# Patient Record
Sex: Female | Born: 1970 | Race: White | Hispanic: No | Marital: Married | Smoking: Never smoker
Health system: Southern US, Community
[De-identification: ages and names within clinical notes are randomized; demographics above are authoritative.]

## PROBLEM LIST (undated history)

## (undated) DIAGNOSIS — J45909 Unspecified asthma, uncomplicated: Secondary | ICD-10-CM

## (undated) DIAGNOSIS — E271 Primary adrenocortical insufficiency: Secondary | ICD-10-CM

## (undated) DIAGNOSIS — G43909 Migraine, unspecified, not intractable, without status migrainosus: Secondary | ICD-10-CM

## (undated) DIAGNOSIS — E162 Hypoglycemia, unspecified: Secondary | ICD-10-CM

## (undated) HISTORY — PX: SINUSOTOMY: SHX291

## (undated) HISTORY — PX: TONSILLECTOMY: SUR1361

---

## 1999-05-26 ENCOUNTER — Other Ambulatory Visit: Admission: RE | Admit: 1999-05-26 | Discharge: 1999-05-26 | Payer: Self-pay | Admitting: Obstetrics and Gynecology

## 2000-06-14 ENCOUNTER — Other Ambulatory Visit: Admission: RE | Admit: 2000-06-14 | Discharge: 2000-06-14 | Payer: Self-pay | Admitting: Obstetrics and Gynecology

## 2001-07-22 ENCOUNTER — Other Ambulatory Visit: Admission: RE | Admit: 2001-07-22 | Discharge: 2001-07-22 | Payer: Self-pay | Admitting: Obstetrics and Gynecology

## 2002-02-11 ENCOUNTER — Encounter: Payer: Self-pay | Admitting: Emergency Medicine

## 2002-02-11 ENCOUNTER — Emergency Department (HOSPITAL_COMMUNITY): Admission: EM | Admit: 2002-02-11 | Discharge: 2002-02-11 | Payer: Self-pay | Admitting: Emergency Medicine

## 2002-07-24 ENCOUNTER — Other Ambulatory Visit: Admission: RE | Admit: 2002-07-24 | Discharge: 2002-07-24 | Payer: Self-pay | Admitting: Obstetrics and Gynecology

## 2002-08-11 ENCOUNTER — Emergency Department (HOSPITAL_COMMUNITY): Admission: EM | Admit: 2002-08-11 | Discharge: 2002-08-11 | Payer: Self-pay | Admitting: *Deleted

## 2003-07-30 ENCOUNTER — Other Ambulatory Visit: Admission: RE | Admit: 2003-07-30 | Discharge: 2003-07-30 | Payer: Self-pay | Admitting: Obstetrics and Gynecology

## 2003-08-12 ENCOUNTER — Encounter: Payer: Self-pay | Admitting: Obstetrics and Gynecology

## 2003-08-12 ENCOUNTER — Ambulatory Visit (HOSPITAL_COMMUNITY): Admission: RE | Admit: 2003-08-12 | Discharge: 2003-08-12 | Payer: Self-pay | Admitting: Obstetrics and Gynecology

## 2004-02-05 ENCOUNTER — Ambulatory Visit (HOSPITAL_COMMUNITY): Admission: RE | Admit: 2004-02-05 | Discharge: 2004-02-05 | Payer: Self-pay | Admitting: Obstetrics and Gynecology

## 2004-02-05 ENCOUNTER — Encounter (INDEPENDENT_AMBULATORY_CARE_PROVIDER_SITE_OTHER): Payer: Self-pay | Admitting: Specialist

## 2004-03-21 ENCOUNTER — Ambulatory Visit (HOSPITAL_COMMUNITY): Admission: RE | Admit: 2004-03-21 | Discharge: 2004-03-21 | Payer: Self-pay | Admitting: Obstetrics and Gynecology

## 2004-06-29 ENCOUNTER — Encounter: Admission: RE | Admit: 2004-06-29 | Discharge: 2004-06-29 | Payer: Self-pay | Admitting: Obstetrics and Gynecology

## 2004-07-29 ENCOUNTER — Other Ambulatory Visit: Admission: RE | Admit: 2004-07-29 | Discharge: 2004-07-29 | Payer: Self-pay | Admitting: Obstetrics and Gynecology

## 2004-12-19 ENCOUNTER — Emergency Department (HOSPITAL_COMMUNITY): Admission: EM | Admit: 2004-12-19 | Discharge: 2004-12-19 | Payer: Self-pay | Admitting: Emergency Medicine

## 2005-08-15 ENCOUNTER — Other Ambulatory Visit: Admission: RE | Admit: 2005-08-15 | Discharge: 2005-08-15 | Payer: Self-pay | Admitting: Obstetrics and Gynecology

## 2008-11-23 ENCOUNTER — Emergency Department (HOSPITAL_BASED_OUTPATIENT_CLINIC_OR_DEPARTMENT_OTHER): Admission: EM | Admit: 2008-11-23 | Discharge: 2008-11-23 | Payer: Self-pay | Admitting: Emergency Medicine

## 2008-11-23 ENCOUNTER — Ambulatory Visit: Payer: Self-pay | Admitting: Diagnostic Radiology

## 2010-10-13 ENCOUNTER — Encounter
Admission: RE | Admit: 2010-10-13 | Discharge: 2010-10-13 | Payer: Self-pay | Source: Home / Self Care | Admitting: Family Medicine

## 2011-02-20 LAB — BASIC METABOLIC PANEL
BUN: 12 mg/dL (ref 6–23)
Calcium: 9.3 mg/dL (ref 8.4–10.5)
GFR calc non Af Amer: >60 mL/min (ref >60)
Glucose, Bld: 85 mg/dL (ref 70–99)
Potassium: 3.9 mEq/L (ref 3.5–5.1)
Sodium: 141 mEq/L (ref 135–145)

## 2011-02-20 LAB — D-DIMER, QUANTITATIVE: D-Dimer, Quant: 0.22 ug/mL-FEU (ref 0.00–0.48)

## 2011-02-20 LAB — CBC
Platelets: 222 10*3/uL (ref 150–400)
RDW: 11.5 % (ref 11.5–15.5)
WBC: 7.7 10*3/uL (ref 4.0–10.5)

## 2011-02-20 LAB — POCT CARDIAC MARKERS
CKMB, poc: <1 ng/mL — ABNORMAL LOW (ref 1.0–8.0)
Myoglobin, poc: 30.5 ng/mL (ref 12–200)

## 2011-03-24 NOTE — H&P (Signed)
NAME:  Caitlyn Hale, Caitlyn Hale                              ACCOUNT NO.:  [id number]   MEDICAL RECORD NO.:  [id number]                   PATIENT TYPE:  AMB   LOCATION:  [facility]C                                  FACILITY:  [facility]   PHYSICIAN:  Juluis Mire, M.D.                DATE OF BIRTH:  1971/05/26   DATE OF ADMISSION:  02/05/2004  DATE OF DISCHARGE:                                HISTORY & PHYSICAL   HISTORY OF PRESENT ILLNESS:  The patient is a 40 year old gravida 2 para 2  married white female who presents for diagnostic laparoscopy with laser  standby.   PRESENT ADMISSION:  The patient has had problems with persistent pelvic pain  and discomfort for the past year.  She has had recurrent ovarian cysts that  have resolved on follow-up.  She has been placed on birth control pills for  management of pain without response.  Of note, she does have an IUD in place  for birth control.  In view of persistent pelvic pain and recurrent cysts we  are contemplating the possibility of endometriosis.  She presents for  laparoscopic evaluation today.   ALLERGIES:  No known drug allergies.   MEDICATIONS:  None.   PAST MEDICAL HISTORY:  Usual childhood diseases, no significant sequelae.  No previous surgical history.   OBSTETRICAL HISTORY:  Two spontaneous vaginal deliveries.   FAMILY HISTORY:  Noncontributory.   SOCIAL HISTORY:  Reveals no tobacco or alcohol use.   REVIEW OF SYSTEMS:  Noncontributory.   PHYSICAL EXAMINATION:  VITAL SIGNS:  The patient is afebrile with stable  vital signs.  HEENT:  The patient is normocephalic.  Pupils equal, round, and reactive to  light and accommodation.  Extraocular movements were intact.  Sclerae and  conjunctivae clear, oropharynx clear.  NECK:  Without thyromegaly.  BREASTS:  No discrete masses.  LUNGS:  Clear.  CARDIOVASCULAR:  Regular rhythm and rate without murmurs or gallops.  ABDOMEN:  Benign.  No masses, organomegaly, or tenderness.  PELVIC:  Normal  external genitalia, vaginal mucosa is clear.  Cervix  unremarkable.  Uterus feels to be normal size, shape, and contour.  Adnexa  free of masses or tenderness.  EXTREMITIES:  Trace edema.  NEUROLOGIC:  Grossly within normal limits.   IMPRESSION:  1. Recurrent ovarian cysts.  2. Chronic pelvic pain, possible endometriosis.   PLAN:  The patient to undergo diagnostic laparoscopy with laser standby.  The risks of surgery have been discussed including the risk of infection;  the risk of hemorrhage; the risk of injury to adjacent organs including  bladder, bowel, or ureters that could require further exploratory surgery;  the risk of deep venous thrombosis and pulmonary embolus.  The patient  professed an understanding of the indications and risks.  Juluis Mire, M.D.    JSM/MEDQ  D:  02/05/2004  T:  02/05/2004  Job:  [id number]

## 2011-03-24 NOTE — Op Note (Signed)
NAME:  Caitlyn Hale, Caitlyn Hale                              ACCOUNT NO.:  [id number]   MEDICAL RECORD NO.:  [id number]                   PATIENT TYPE:  AMB   LOCATION:  SDC                                  FACILITY:  [facility]   PHYSICIAN:  Juluis Mire, M.D.                DATE OF BIRTH:  Mar 30, 1971   DATE OF PROCEDURE:  02/05/2004  DATE OF DISCHARGE:                                 OPERATIVE REPORT   PREOPERATIVE DIAGNOSIS:  Pelvic pain with recurrent ovarian cyst.   POSTOPERATIVE DIAGNOSIS:  Pelvic pain with recurrent ovarian cyst with  evidence of a simple cyst of the right ovary and pelvic endometriosis.   OPERATIVE PROCEDURE:  Laparoscopy, cautery of endometriotic implants, right  ovarian cystectomy.   SURGEON:  Juluis Mire, M.D.   ANESTHESIA:  General endotracheal anesthesia.   ESTIMATED BLOOD LOSS:  Minimal.   PACKS AND DRAINS:  None.   BLOOD REPLACED:  None.   COMPLICATIONS:  None.   INDICATIONS FOR PROCEDURE:  As noted in the history and physical.   PROCEDURE IN DETAIL:  The patient was taken to the OR and placed in supine  position.  After a satisfactory level of general endotracheal anesthesia was  obtained, the patient was placed in the dorsal lithotomy position using the  Allen stirrups.  The abdomen, perineum, and vagina were prepped out with  Betadine.  The bladder was emptied with in and out catheterization.  A Hulka  tenaculum was put in place and secured.  The patient was draped out for  laparoscopy.  A subumbilical incision was made with the knife.  The Veress  needle was introduced into the abdominal cavity.  The abdomen was inflated  with approximately 3 liters of carbon dioxide.  The operating laparoscope  was introduced.  There was no notice of injuries to adjacent organs.  The 5  mm trocar was put in place in the suprapubic area under direct  visualization.  The uterus was normal size and shape.  The left tube and  ovary were unremarkable.  The right ovary had  a simple cyst.  She had a  couple of implants of endometriosis in the cul-de-sac area below the cervix.  It was superficial in nature.  Using the bipolar, both areas of  endometriosis were cauterized.  We isolated the right ovary.  We made an  incision into the cyst, yellowish fluid was obtained.  We removed the cyst  lining and it was sent for pathological review.  We used the bipolar to  bring about hemostasis in the right ovary.  We had good hemostasis at this  point.  The upper abdomen including the liver and tip of the gallbladder  were normal.  The cecum was difficult to visualize.  It looked like she had  a redundant ascending colon.  The appendix could not be visualized.  There  was no  evidence of any active bleeding or injury to adjacent organs.  The  ovary was hemostatically intact.  The abdomen was desufflated of carbon  dioxide while all trocars were removed.  The subumbilical incision was  closed with interrupted subcuticular 4-0 Vicryl, the suprapubic incision was  closed with Dermabond.  The Hulka tenaculum was then removed.  Visualization  with the speculum revealed  the IUD to still be in place.  The patient was taken out of the dorsal  lithotomy position, and once alert and extubated, was transferred to the  recovery room in good condition.  Sponge, instrument, and needle counts was  reported correct by the circulating nurse x 2.                                               Juluis Mire, M.D.    JSM/MEDQ  D:  02/05/2004  T:  02/05/2004  Job:  [id number]

## 2012-02-26 ENCOUNTER — Other Ambulatory Visit: Payer: Self-pay | Admitting: Obstetrics and Gynecology

## 2012-02-26 DIAGNOSIS — Z1231 Encounter for screening mammogram for malignant neoplasm of breast: Secondary | ICD-10-CM

## 2012-03-12 ENCOUNTER — Ambulatory Visit
Admission: RE | Admit: 2012-03-12 | Discharge: 2012-03-12 | Disposition: A | Discharge status: Home / Self Care | Payer: PRIVATE HEALTH INSURANCE | Source: Ambulatory Visit | Attending: Obstetrics and Gynecology | Admitting: Obstetrics and Gynecology

## 2012-03-12 DIAGNOSIS — Z1231 Encounter for screening mammogram for malignant neoplasm of breast: Secondary | ICD-10-CM

## 2013-04-07 ENCOUNTER — Other Ambulatory Visit: Payer: Self-pay

## 2013-04-07 DIAGNOSIS — Z1231 Encounter for screening mammogram for malignant neoplasm of breast: Secondary | ICD-10-CM

## 2013-04-17 ENCOUNTER — Other Ambulatory Visit (HOSPITAL_COMMUNITY)
Admission: RE | Admit: 2013-04-17 | Discharge: 2013-04-17 | Disposition: A | Discharge status: Home / Self Care | Payer: BC Managed Care – PPO | Source: Ambulatory Visit | Attending: Obstetrics and Gynecology | Admitting: Obstetrics and Gynecology

## 2013-04-17 ENCOUNTER — Other Ambulatory Visit: Payer: Self-pay | Admitting: Nurse Practitioner

## 2013-04-17 DIAGNOSIS — Z1151 Encounter for screening for human papillomavirus (HPV): Secondary | ICD-10-CM | POA: Insufficient documentation

## 2013-04-17 DIAGNOSIS — Z01419 Encounter for gynecological examination (general) (routine) without abnormal findings: Secondary | ICD-10-CM | POA: Insufficient documentation

## 2013-04-30 ENCOUNTER — Ambulatory Visit
Admission: RE | Admit: 2013-04-30 | Discharge: 2013-04-30 | Disposition: A | Discharge status: Home / Self Care | Payer: PRIVATE HEALTH INSURANCE | Source: Ambulatory Visit

## 2013-04-30 DIAGNOSIS — Z1231 Encounter for screening mammogram for malignant neoplasm of breast: Secondary | ICD-10-CM

## 2014-05-21 ENCOUNTER — Other Ambulatory Visit: Payer: Self-pay

## 2014-05-21 DIAGNOSIS — Z1231 Encounter for screening mammogram for malignant neoplasm of breast: Secondary | ICD-10-CM

## 2014-05-22 ENCOUNTER — Other Ambulatory Visit (HOSPITAL_COMMUNITY)
Admission: RE | Admit: 2014-05-22 | Discharge: 2014-05-22 | Disposition: A | Discharge status: Home / Self Care | Payer: Managed Care, Other (non HMO) | Source: Ambulatory Visit | Attending: Obstetrics and Gynecology | Admitting: Obstetrics and Gynecology

## 2014-05-22 ENCOUNTER — Other Ambulatory Visit: Payer: Self-pay | Admitting: Obstetrics and Gynecology

## 2014-05-22 DIAGNOSIS — Z01419 Encounter for gynecological examination (general) (routine) without abnormal findings: Secondary | ICD-10-CM | POA: Insufficient documentation

## 2014-05-25 ENCOUNTER — Encounter (INDEPENDENT_AMBULATORY_CARE_PROVIDER_SITE_OTHER): Payer: Self-pay

## 2014-05-25 ENCOUNTER — Ambulatory Visit
Admission: RE | Admit: 2014-05-25 | Discharge: 2014-05-25 | Disposition: A | Discharge status: Home / Self Care | Payer: Managed Care, Other (non HMO) | Source: Ambulatory Visit

## 2014-05-25 DIAGNOSIS — Z1231 Encounter for screening mammogram for malignant neoplasm of breast: Secondary | ICD-10-CM

## 2014-05-25 LAB — CYTOLOGY - PAP

## 2014-05-27 ENCOUNTER — Other Ambulatory Visit: Payer: Self-pay | Admitting: Obstetrics and Gynecology

## 2014-05-27 DIAGNOSIS — R928 Other abnormal and inconclusive findings on diagnostic imaging of breast: Secondary | ICD-10-CM

## 2014-05-29 ENCOUNTER — Ambulatory Visit
Admission: RE | Admit: 2014-05-29 | Discharge: 2014-05-29 | Disposition: A | Discharge status: Home / Self Care | Payer: Managed Care, Other (non HMO) | Source: Ambulatory Visit | Attending: Obstetrics and Gynecology | Admitting: Obstetrics and Gynecology

## 2014-05-29 DIAGNOSIS — R928 Other abnormal and inconclusive findings on diagnostic imaging of breast: Secondary | ICD-10-CM

## 2015-03-15 ENCOUNTER — Emergency Department (HOSPITAL_COMMUNITY): Payer: BLUE CROSS/BLUE SHIELD

## 2015-03-15 ENCOUNTER — Encounter (HOSPITAL_COMMUNITY): Payer: Self-pay | Admitting: Emergency Medicine

## 2015-03-15 ENCOUNTER — Emergency Department (HOSPITAL_COMMUNITY)
Admission: EM | Admit: 2015-03-15 | Discharge: 2015-03-15 | Disposition: A | Discharge status: Home / Self Care | Payer: BLUE CROSS/BLUE SHIELD | Attending: Emergency Medicine | Admitting: Emergency Medicine

## 2015-03-15 DIAGNOSIS — Z91040 Latex allergy status: Secondary | ICD-10-CM | POA: Diagnosis not present

## 2015-03-15 DIAGNOSIS — Z7952 Long term (current) use of systemic steroids: Secondary | ICD-10-CM | POA: Insufficient documentation

## 2015-03-15 DIAGNOSIS — Z79899 Other long term (current) drug therapy: Secondary | ICD-10-CM | POA: Diagnosis not present

## 2015-03-15 DIAGNOSIS — E271 Primary adrenocortical insufficiency: Secondary | ICD-10-CM | POA: Diagnosis present

## 2015-03-15 DIAGNOSIS — R51 Headache: Secondary | ICD-10-CM | POA: Insufficient documentation

## 2015-03-15 HISTORY — DX: Primary adrenocortical insufficiency: E27.1

## 2015-03-15 LAB — CBC WITH DIFFERENTIAL/PLATELET
BASOS ABS: 0 10*3/uL (ref 0.0–0.1)
BASOS PCT: 0 % (ref 0–1)
Eosinophils Absolute: 0.4 10*3/uL (ref 0.0–0.7)
Eosinophils Relative: 3 % (ref 0–5)
HCT: 43.1 % (ref 36.0–46.0)
Hemoglobin: 13.5 g/dL (ref 12.0–15.0)
Lymphocytes Relative: 10 % — ABNORMAL LOW (ref 12–46)
Lymphs Abs: 1.3 10*3/uL (ref 0.7–4.0)
MCH: 29.5 pg (ref 26.0–34.0)
MCHC: 31.3 g/dL (ref 30.0–36.0)
MCV: 94.1 fL (ref 78.0–100.0)
Monocytes Absolute: 0.3 10*3/uL (ref 0.1–1.0)
Monocytes Relative: 2 % — ABNORMAL LOW (ref 3–12)
NEUTROS ABS: 11.4 10*3/uL — AB (ref 1.7–7.7)
NEUTROS PCT: 85 % — AB (ref 43–77)
Platelets: 214 10*3/uL (ref 150–400)
RBC: 4.58 MIL/uL (ref 3.87–5.11)
RDW: 12.7 % (ref 11.5–15.5)
WBC: 13.4 10*3/uL — ABNORMAL HIGH (ref 4.0–10.5)

## 2015-03-15 LAB — URINALYSIS, ROUTINE W REFLEX MICROSCOPIC
Bilirubin Urine: NEGATIVE
Glucose, UA: NEGATIVE mg/dL
Hgb urine dipstick: NEGATIVE
KETONES UR: NEGATIVE mg/dL
LEUKOCYTES UA: NEGATIVE
NITRITE: NEGATIVE
PROTEIN: NEGATIVE mg/dL
SPECIFIC GRAVITY, URINE: 1.007 (ref 1.005–1.030)
Urobilinogen, UA: 0.2 mg/dL (ref 0.0–1.0)
pH: 5.5 (ref 5.0–8.0)

## 2015-03-15 LAB — COMPREHENSIVE METABOLIC PANEL
ALT: 14 U/L (ref 14–54)
AST: 19 U/L (ref 15–41)
Albumin: 4.3 g/dL (ref 3.5–5.0)
Alkaline Phosphatase: 70 U/L (ref 38–126)
Anion gap: 11 (ref 5–15)
BILIRUBIN TOTAL: 1.1 mg/dL (ref 0.3–1.2)
BUN: 14 mg/dL (ref 6–20)
CHLORIDE: 106 mmol/L (ref 101–111)
CO2: 25 mmol/L (ref 22–32)
Calcium: 9.3 mg/dL (ref 8.9–10.3)
Creatinine, Ser: 0.66 mg/dL (ref 0.44–1.00)
GFR calc Af Amer: >60 mL/min (ref >60)
GLUCOSE: 96 mg/dL (ref 70–99)
POTASSIUM: 4.2 mmol/L (ref 3.5–5.1)
Sodium: 142 mmol/L (ref 135–145)
Total Protein: 7.7 g/dL (ref 6.5–8.1)

## 2015-03-15 LAB — CBG MONITORING, ED: Glucose-Capillary: 84 mg/dL (ref 70–99)

## 2015-03-15 MED ORDER — ONDANSETRON 4 MG PO TBDP
4.0000 mg | ORAL_TABLET | Freq: Once | ORAL | Status: AC
  Administered 2015-03-15: 4 mg via ORAL
  Filled 2015-03-15: qty 1

## 2015-03-15 MED ORDER — HYDROCODONE-ACETAMINOPHEN 5-325 MG PO TABS
2.0000 | ORAL_TABLET | Freq: Once | ORAL | Status: AC
  Administered 2015-03-15: 1 via ORAL
  Filled 2015-03-15: qty 2

## 2015-03-15 NOTE — ED Notes (Signed)
Patient with Hx of addison's disease c/o syncopal episode today x 10 minutes, nausea, low O2. Patient's endocrinologist had her quit her florinest on Thursday and her Hydrocortisone on Friday, which she has been taking x 1 year, in order to do cortisol stem test. Pt states she did not wean of steroid at all. 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 Patient received 4 mg of zofran and 80 mg solu-medrol at 1530, 5 mg hydrocortisone at 1100. Patient alert and oriented currently.

## 2015-03-15 NOTE — Discharge Instructions (Signed)
Addison Disease Take florinef and hydrocortisone as prescribed by your endocrinologist until you see him.  Call the office tomorrow to make an appointment with Dr. Shawnee KnappLevy. Return for syncope or light headedness.  Addison disease is a glandular (endocrine) or hormonal disorder. It is also called adrenal insufficiency. It affects about 1 in 100,000 people. It can affect men and women in all age groups. This disease occurs when the adrenal glands do not make enough of the hormone cortisol. In some cases, the adrenal glands also do not make the hormonealdosterone. Without the right levels of these hormones, your body cannot maintain critical life functions. The adrenal glands are located just above the kidneys. Cortisol is a steroid hormone. Its most important job is to help the body respond to stress. Cortisol also helps the body to:  Maintain blood pressure and heart (cardiovascular) function.  Slow the immune system's response to inflammation.  Control the use of proteins, carbohydrates (sugars), and fats.  Maintain a sense of well-being. CAUSES  A lack of cortisol can happen for different reasons.   Primary adrenal insufficiency is Addison disease. The adrenal glands do not produce enough, or any, cortisol. Usually, aldosterone is also not produced.  Secondary adrenal insufficiency. The pituitary gland may not make enough of a hormone called ACTH (adrenocorticotropin). This hormone causes the adrenal glands to produce cortisol. Usually, there is enough aldosterone. Primary Adrenal Insufficiency can be caused by:  Autoimmune disease. Your body can produce antibodies that attack its own organs (in this case, your adrenal glands). The reasons why this happens are not well understood at this time. Sometimes, other organs are also affected (the polyglandular autoimmune syndromes).  An infection of the adrenal glands. Possible causes include tuberculosis, viruses (including HIV), and fungal  infections. Secondary Adrenal Insufficiency This form is much more common than the primary form. It can be traced to a lack of ACTH. Without ACTH, the adrenal glands cannot make cortisol. Causes include:  Diseases of the pituitary gland. This is a small gland in the brain that controls many important body functions. The pituitary gland produces ACTH, among other hormones. Pituitary gland tumors, injury, or surgery can cause inadequate ACTH production, which causes inadequate cortisol production.  Medications:  Megestrol (used for cancer treatment and to stimulate appetite) and some pain medications can impair production of cortisol.  Use of cortisol medication (steroids) causes your adrenal glands to not produce cortisol. When you stop this medication, it can take time for the adrenal glands to start producing cortisol again. This can be a dangerous situation, requiring slowly reducing your cortisol medicine. SYMPTOMS  Symptoms of adrenal insufficiency normally begin slowly. Problems seen with the disease are:  Severe tiredness (fatigue).  Muscle weakness.  Loss of appetite.  Weight loss. About 50% of the time, the following symptoms occur:   Nausea, vomiting and diarrhea.  Drops in blood pressure.  Dizziness or fainting.  Darkening of the skin (with primary disease only).  Being easily angered (irritable).  Depression.  Salt craving.  Low blood sugar (hypoglycemia).  Irregular or no menstrual periods. The symptoms slowly get worse. They are often ignored until a stressful event like an illness or an accident occurs. This is called an Addisonian crisis, or acute adrenal insufficiency. Without treatment, an Addisonian crisis can cause death. Symptoms of an Addisonian crisis include:  Sudden, severe pain in the lower back, abdomen, or legs.  Severe vomiting and diarrhea.  Dehydration.  Low blood pressure.  Loss of consciousness. DIAGNOSIS  In  its early stages,  adrenal insufficiency can be hard to diagnose. Your caregiver will need to:  Review your medical history.  Review your symptoms, such as dark tanning of the skin.  Perform lab tests. Results will show if levels of cortisol are too low, and will help identify the cause. CT scan or MRI scan of the adrenal and pituitary glands may also be necessary. TREATMENT  With proper treatment, you can live a normal life with Addison disease or adrenal insufficiency.  Missing hormones need to be replaced in order to treat Addison disease. Cortisol is replaced with hydrocortisone tablets taken by mouth. Other forms of cortisol may also be used. Since cortisone levels normally are higher in the morning and lower in the evening, you may need different doses at different times of the day. Be sure to follow your instructions carefully.  If your body cannot maintain the right levels of salt (sodium) and fluids because of too little aldosterone, you will also be given a medication. This drug replaces aldosterone. Important points about treatment:  Any sudden (acute) illness can increase your body's need for cortisol. Surgery, or other stress on the body, can do the same. If you have Addison disease and you are ill or having surgery, you will need an increase in hormone medication to prevent an Addisonian crisis. Untreated, an Addisonian crisis can cause death.  If you are too ill to take your medication or you cannot keep it down, you must take medicine through a shot (injection). You or someone who lives with you will need to learn how to give you this injection. The shot will take the place of hydrocortisone. If you find it necessary to give yourself injectable medication, call your caregiver right away, or go to the nearest hospital emergency room. HOME CARE INSTRUCTIONS   Always carry an identification card stating your condition in case of an emergency. The card should:  Alert emergency personnel about the need  to inject 100 mg of cortisol if you are severely hurt or cannot respond.  Include your caregiver's name and phone number.  Include the name and number of your closest relative to contact.  When traveling, carry a needle, syringe, and an injectable form of cortisol for emergencies.  Know how to increase medication during periods of stress or mild colds.  Wear a warning bracelet or neck chain to alert emergency personnel. Many companies sell medical ID products.  Take your medications as prescribed. Do not stop your medications without medical supervision.  Learn about your condition. Ask your caregiver for further resources. This is a potentially dangerous, but easily managed illness. SEEK MEDICAL CARE IF:   You have Addison disease and you are in need of surgery.  You have Addison disease and you have an acute illness.  You have weakness, weight loss, or other unexplained symptoms. SEEK IMMEDIATE MEDICAL CARE IF:   You have symptoms of crisis:  Sudden, severe pain in the lower back, abdomen, or legs.  Severe vomiting and diarrhea.  Dehydration.  Low blood pressure.  Loss of consciousness.  You are experiencing severe:  Infections or other illness.  Vomiting.  Diarrhea. Document Released: 10/23/2005 Document Revised: 03/09/2014 Document Reviewed: 03/16/2009 [facility] Patient Information 2015 [place]. This information is not intended to replace advice given to you by your health care provider. Make sure you discuss any questions you have with your health care provider.

## 2015-03-15 NOTE — ED Provider Notes (Signed)
CSN: [id number]     Arrival date & time 03/15/15  1619 History   First MD Initiated Contact with Patient 03/15/15 1704     Chief Complaint  Patient presents with  . Addisons Crisis      (Consider location/radiation/quality/duration/timing/severity/associated sxs/prior Treatment) The history is provided by the patient. No language interpreter was used.  Caitlyn Hale is a [age] y.o female with a history of Addison's disease who has had intermittent dizziness and a syncopal episode in December which was not worked up. She was taken off of her florinef on Thursday and Hydrocortisone on Friday by her endocrinologist because he wanted to see if she had true Addison's disease. She started not feeling well yesterday with a headache and blurred vision. Today while at work she felt light headed with nausea.  She states she took some zofran and 5mg  of hydrocotrisone.  She said she passed out while at work and could not respond but could hear everything around her.  She states she became diaphoretic but was put on oxygen and felt better.  She went on about her day and ate lunch.  After lunch, she became diaphoretic and had a true syncopal episode for a couple of minutes.  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  He gave her solumedrol at 15:30.  She states she is still feeling dizzy, has a headache and is nauseated now.   She denies any diplopia, chest pain, shortness of breath, abdominal pain, vomiting, diarrhea, or leg swelling.   Past Medical History  Diagnosis Date  . Addison's disease    No past surgical history on file. No family history on file. History  Substance Use Topics  . Smoking status: Not on file  . Smokeless tobacco: Not on file  . Alcohol Use: Not on file   OB History    No data available     Review of Systems  Constitutional: Negative for fever and appetite change.  Gastrointestinal: Positive for nausea. Negative for  abdominal pain.  Skin: Negative for color change.  Neurological: Negative for weakness.  All other systems reviewed and are negative.     Allergies  Latex; Benadryl; and Ibuprofen  Home Medications   Prior to Admission medications   Medication Sig Start Date End Date Taking? Authorizing Provider  albuterol (PROVENTIL HFA;VENTOLIN HFA) 108 (90 BASE) MCG/ACT inhaler Inhale 2 puffs into the lungs every 6 (six) hours as needed for wheezing or shortness of breath.  12/25/14  Yes Historical Provider, MD  CAMILA 0.35 MG tablet Take 1 tablet by mouth daily. 02/12/15  Yes Historical Provider, MD  fludrocortisone (FLORINEF) 0.1 MG tablet Take 0.1 mg by mouth daily. 12/21/14  Yes Historical Provider, MD  hydrocortisone (CORTEF) 5 MG tablet Take 1 tablet by mouth 2 (two) times daily. 09/25/14  Yes Historical Provider, MD  SUMAtriptan (IMITREX) 25 MG tablet Take 1 tablet by mouth every 2 (two) hours as needed for migraine.  12/25/14  Yes Historical Provider, MD   BP 127/79 mmHg  Pulse 74  Temp(Src) 98.2 F (36.8 C) (Oral)  Resp 13  SpO2 95% Physical Exam  Constitutional: She is oriented to person, place, and time. She appears well-developed and well-nourished.  HENT:  Head: Normocephalic and atraumatic.  Eyes: Conjunctivae are normal.  Neck: Normal range of motion. Neck supple.  Cardiovascular: Normal rate, regular rhythm and normal heart sounds.   Pulmonary/Chest: Effort normal and breath  sounds normal.  Abdominal: Soft. There is no tenderness.  Musculoskeletal: Normal range of motion.  Neurological: She is alert and oriented to person, place, and time.  Skin: Skin is warm and dry.  Nursing note and vitals reviewed.   ED Course  Procedures (including critical care time) Labs Review Labs Reviewed  CBC WITH DIFFERENTIAL/PLATELET - Abnormal; Notable for the following:    WBC 13.4 (*)    Neutrophils Relative % 85 (*)    Neutro Abs 11.4 (*)    Lymphocytes Relative 10 (*)    Monocytes  Relative 2 (*)    All other components within normal limits  COMPREHENSIVE METABOLIC PANEL  URINALYSIS, ROUTINE W REFLEX MICROSCOPIC  CBG MONITORING, ED    Imaging Review Ct Head Wo Contrast  03/15/2015   CLINICAL DATA:  Syncope, nausea pole hypoxia. Recent change in medications for Addison's disease.  EXAM: CT HEAD WITHOUT CONTRAST  TECHNIQUE: Contiguous axial images were obtained from the base of the skull through the vertex without intravenous contrast.  COMPARISON:  12/04/2007  FINDINGS: There is no intracranial hemorrhage, mass or evidence of acute infarction. Gray matter and white matter appear normal. Brainstem and posterior fossa are unremarkable. The ventricles and basal cisterns appear normal.  The bony structures are intact. There is prior sinonasal surgery. There is a right sphenoid sinus air-fluid level. There is mild membrane thickening in the ethmoids.  IMPRESSION: Normal brain  Paranasal sinus disease, with right sphenoid sinus air-fluid level.   Electronically Signed   By: Ellery Plunkaniel R Mitchell M.D.   On: 03/15/2015 18:17     EKG Interpretation None      MDM   Final diagnoses:  Addison's disease  Patient with Addison's disease presents after syncopal episode today while at work. She states she was taken off her hydrocortisone and Florinest on Friday to have a stem test done on 5/12.   She was given hydrocortisone at 11:00 and 80mg  solumedrol at 15:30. She states she is feeling better but still dizzy with a headache.  I spoke to Dr. Juleen ChinaKohut regarding the patient and he has seen her.  18:15 I spoke to Dr. Phineas DouglasLambet, one [facility] the [facility] who works in the same practice as the patient physician Dr. Crista CurbMatthew Levy. He states that the patient could resume her medications that she was taking and that he would discuss the case with him in the morning.  The patient can follow up with Dr. Shawnee KnappLevy in the office.    Patient is stable and in no acute distress.  Her  vitals are normal. She will resume her florinef and hydrocortisone until she she's Dr. Shawnee KnappLevy and agrees with the plan.  I have given her return precautions.   Catha GosselinHanna Patel-Mills, PA-C 03/15/15 1834  Raeford RazorStephen Kohut, MD 03/17/15 [phone]

## 2015-03-15 NOTE — ED Notes (Signed)
Pt placed on cardiac monitor, NSR.

## 2015-03-23 ENCOUNTER — Emergency Department (HOSPITAL_COMMUNITY)
Admission: EM | Admit: 2015-03-23 | Discharge: 2015-03-24 | Disposition: A | Discharge status: Home / Self Care | Payer: BLUE CROSS/BLUE SHIELD | Attending: Emergency Medicine | Admitting: Emergency Medicine

## 2015-03-23 ENCOUNTER — Encounter (HOSPITAL_COMMUNITY): Payer: Self-pay | Admitting: Emergency Medicine

## 2015-03-23 DIAGNOSIS — Z7952 Long term (current) use of systemic steroids: Secondary | ICD-10-CM | POA: Diagnosis not present

## 2015-03-23 DIAGNOSIS — R42 Dizziness and giddiness: Secondary | ICD-10-CM | POA: Insufficient documentation

## 2015-03-23 DIAGNOSIS — G43909 Migraine, unspecified, not intractable, without status migrainosus: Secondary | ICD-10-CM | POA: Insufficient documentation

## 2015-03-23 DIAGNOSIS — R55 Syncope and collapse: Secondary | ICD-10-CM | POA: Insufficient documentation

## 2015-03-23 DIAGNOSIS — R4020 Unspecified coma: Secondary | ICD-10-CM

## 2015-03-23 DIAGNOSIS — J45909 Unspecified asthma, uncomplicated: Secondary | ICD-10-CM | POA: Insufficient documentation

## 2015-03-23 DIAGNOSIS — Z91040 Latex allergy status: Secondary | ICD-10-CM | POA: Diagnosis not present

## 2015-03-23 DIAGNOSIS — Z8639 Personal history of other endocrine, nutritional and metabolic disease: Secondary | ICD-10-CM | POA: Insufficient documentation

## 2015-03-23 DIAGNOSIS — Z79899 Other long term (current) drug therapy: Secondary | ICD-10-CM | POA: Diagnosis not present

## 2015-03-23 HISTORY — DX: Hypoglycemia, unspecified: E16.2

## 2015-03-23 HISTORY — DX: Migraine, unspecified, not intractable, without status migrainosus: G43.909

## 2015-03-23 HISTORY — DX: Unspecified asthma, uncomplicated: J45.909

## 2015-03-23 LAB — I-STAT CHEM 8, ED
BUN: 18 mg/dL (ref 6–20)
CALCIUM ION: 1.22 mmol/L (ref 1.12–1.23)
CHLORIDE: 103 mmol/L (ref 101–111)
Creatinine, Ser: 0.7 mg/dL (ref 0.44–1.00)
GLUCOSE: 181 mg/dL — AB (ref 65–99)
HCT: 47 % — ABNORMAL HIGH (ref 36.0–46.0)
Hemoglobin: 16 g/dL — ABNORMAL HIGH (ref 12.0–15.0)
Potassium: 3.8 mmol/L (ref 3.5–5.1)
Sodium: 141 mmol/L (ref 135–145)
TCO2: 22 mmol/L (ref 0–100)

## 2015-03-23 LAB — BASIC METABOLIC PANEL
ANION GAP: 11 (ref 5–15)
BUN: 18 mg/dL (ref 6–20)
CALCIUM: 9.8 mg/dL (ref 8.9–10.3)
CO2: 24 mmol/L (ref 22–32)
CREATININE: 0.75 mg/dL (ref 0.44–1.00)
Chloride: 105 mmol/L (ref 101–111)
GFR calc Af Amer: >60 mL/min (ref >60)
GFR calc non Af Amer: >60 mL/min (ref >60)
Glucose, Bld: 167 mg/dL — ABNORMAL HIGH (ref 65–99)
Potassium: 3.9 mmol/L (ref 3.5–5.1)
Sodium: 140 mmol/L (ref 135–145)

## 2015-03-23 LAB — CBC
HCT: 43.5 % (ref 36.0–46.0)
Hemoglobin: 14.5 g/dL (ref 12.0–15.0)
MCH: 30.7 pg (ref 26.0–34.0)
MCHC: 33.3 g/dL (ref 30.0–36.0)
MCV: 92.2 fL (ref 78.0–100.0)
PLATELETS: 249 10*3/uL (ref 150–400)
RBC: 4.72 MIL/uL (ref 3.87–5.11)
RDW: 12.5 % (ref 11.5–15.5)
WBC: 16.7 10*3/uL — ABNORMAL HIGH (ref 4.0–10.5)

## 2015-03-23 NOTE — ED Provider Notes (Signed)
CSN: [id number]     Arrival date & time 03/23/15  2036 History   First MD Initiated Contact with Patient 03/23/15 2155     Chief Complaint  Patient presents with  . Loss of Consciousness    HPI   44 year old female presents today with syncope. Patient has a significant past medical history of Addison's disease with intermittent dizziness syncopal episodes starting in December, she was not seen for this at a time. Patient is being followed by an endocrinologist for her Addison's disease and taken off her Florinef and hydrocortisone  in the beginning of May 2016 in order to further workup her suspected Addison's disease. Patient had syncopal event on 03/15/2015 and was seen in the emergency room. Patient was instructed to follow up with her endocrinologist and primary care provider at that time, she reports that she was unsatisfied with their care and did not follow up with them. 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 Patient reports that today she had similar presentation to previous episodes while at work, experience dizziness, diaphoresis, syncope. She was given Solu-Medrol 125 and fluids and felt better. She reports again this evening while eating dinner she had return of the symptoms, again syncope. She EMS transport at that time. Patient reports that she is not having any signs of infection including fever, cough, upper respiratory symptoms. She denies chest pain, shortness of breath, abdominal pain, swelling of the extremities. She denies changes in her bowel or bladder characteristics or frequency. Patient denies trauma from the fall as she is able to anticipate these and give to seek position before she was consciousness, no sign of seizure activity no history of seizure other than febrile seizure at age of 62. Patient denies drug or alcohol use.    Past Medical History  Diagnosis Date  . Addison's disease   .  Hypoglycemia   . Asthma   . Migraines    Past Surgical History  Procedure Laterality Date  . Sinusotomy    . Tonsillectomy     No family history on file. History  Substance Use Topics  . Smoking status: Never Smoker   . Smokeless tobacco: Not on file  . Alcohol Use: No   OB History    No data available     Review of Systems  All other systems reviewed and are negative.   Allergies  Latex; Alcohol-sulfur; Benadryl; and Ibuprofen  Home Medications   Prior to Admission medications   Medication Sig Start Date End Date Taking? Authorizing Provider  albuterol (PROVENTIL HFA;VENTOLIN HFA) 108 (90 BASE) MCG/ACT inhaler Inhale 2 puffs into the lungs every 6 (six) hours as needed for wheezing or shortness of breath.  12/25/14  Yes Historical Provider, MD  fludrocortisone (FLORINEF) 0.1 MG tablet Take 0.1 mg by mouth daily. 12/21/14  Yes Historical Provider, MD  hydrocortisone (CORTEF) 5 MG tablet Take 1 tablet by mouth 2 (two) times daily. 09/25/14  Yes Historical Provider, MD  SUMAtriptan (IMITREX) 25 MG tablet Take 1 tablet by mouth every 2 (two) hours as needed for migraine.  12/25/14  Yes Historical Provider, MD  CAMILA 0.35 MG tablet Take 1 tablet by mouth daily. 02/12/15   Historical Provider, MD   BP 139/77 mmHg  Pulse 89  Temp(Src) 98.2 F (36.8 C) (Oral)  Resp 22  SpO2 96% Physical Exam  Constitutional: She is oriented to person, place, and time. She appears well-developed and well-nourished.  HENT:  Head: Normocephalic and atraumatic.  Eyes: Pupils are equal, round, and reactive to light.  Neck: Normal range of motion. Neck supple. No JVD present. No tracheal deviation present. No thyromegaly present.  Cardiovascular: Normal rate, regular rhythm, normal heart sounds and intact distal pulses.  Exam reveals no gallop and no friction rub.   No murmur heard. Pulmonary/Chest: Effort normal and breath sounds normal. No stridor. No respiratory distress. She has no wheezes. She  has no rales. She exhibits no tenderness.  Musculoskeletal: Normal range of motion.  Lymphadenopathy:    She has no cervical adenopathy.  Neurological: She is alert and oriented to person, place, and time. She has normal strength. No cranial nerve deficit or sensory deficit. Coordination normal. GCS eye subscore is 4. GCS verbal subscore is 5. GCS motor subscore is 6.  Skin: Skin is warm and dry.  Psychiatric: She has a normal mood and affect. Her behavior is normal. Judgment and thought content normal.  Nursing note and vitals reviewed.   ED Course  Procedures (including critical care time) Labs Review Labs Reviewed  CBC - Abnormal; Notable for the following:    WBC 16.7 (*)    All other components within normal limits  BASIC METABOLIC PANEL - Abnormal; Notable for the following:    Glucose, Bld 167 (*)    All other components within normal limits  I-STAT CHEM 8, ED - Abnormal; Notable for the following:    Glucose, Bld 181 (*)    Hemoglobin 16.0 (*)    HCT 47.0 (*)    All other components within normal limits    Imaging Review No results found.   EKG Interpretation   Date/Time:  Tuesday Mar 23 2015 21:14:21 EDT Ventricular Rate:  126 PR Interval:  135 QRS Duration: 90 QT Interval:  331 QTC Calculation: 479 R Axis:   -93 Text Interpretation:  Sinus tachycardia Consider right atrial enlargement  LAD, consider left anterior fascicular block Abnormal R-wave progression,  late transition No significant change was found Confirmed by CAMPOS  MD,  Caryn Bee ([id number]) on 03/23/2015 10:42:56 PM      MDM   Final diagnoses:  Loss of consciousness    Labs: Urinalysis, i-STAT Chem-8, CBC, BMP- mild leukocytosis 16.7, slightly elevated glucose at 181  Imaging: None indicated  Consults: None  Therapeutics: None  Assessment: Loss of consciousness  Plan: Patient presents with loss of consciousness today; no trauma. This has been an ongoing thing for her, she was originally  been seen by her primary care and endocrinologist as they felt this was due to her Addison's disease. He is had some medication changes as well, is unable to ascertain if this is exacerbating the problem. Today's evaluation showed no acute signs or symptoms that would indicate further evaluation and management in the emergency department setting, her laboratory work noncontributory, she had a slight elevation in her heart rate upon standing with or static vital signs but no significant drop in blood pressure. Patient did not follow-up with her endocrinologist after her last visit in the ED, as she was unhappy with his care. Patient reports that she is able to anticipate these episodes, able to get to a safe position before she passes out, and able to come back to baseline with fluids. She denies significant dehydration, reports good by mouth intake, no signs of infection. Patient denies any chest pain, shortness of breath, or any other concerning signs or symptoms that would indicate this is due to respiratory or cardiovascular  involvement; no history of seizure or seizure activity, no postictal phase. It is uncertain what is causing her syncopal events, I discussed this in detail with the patient informing her that appropriate follow-up is necessary to determine cause of these episodes. 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 Patient was given safety instructions including monitoring her vital signs, by mouth intake, and precipitating factors that may lead to syncope. She is instructed return to the emergency room if she has any more syncopal episodes. Patient and her husband both verbalized understanding and agreement for today's plan and had no further questions at  time of discharge. Patient's case was discussed with Dr. Patria Maneampos who agreed with my assessment and plan. Patient is advised to continue her taking her prescribed medications until follow-up.      Eyvonne MechanicJeffrey Karn Derk, PA-C 03/24/15 1448  Azalia BilisKevin Campos, MD 03/26/15 [phone]

## 2015-03-23 NOTE — ED Notes (Signed)
Pt states that she has Addison's and has had several episodes of LOC. 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 Passed out again tonight and was tachycardic upon EMS arrival. Alert and oriented at this time.

## 2015-03-24 LAB — URINALYSIS, ROUTINE W REFLEX MICROSCOPIC
Bilirubin Urine: NEGATIVE
Glucose, UA: >1000 mg/dL — AB
Hgb urine dipstick: NEGATIVE
Ketones, ur: NEGATIVE mg/dL
Leukocytes, UA: NEGATIVE
Nitrite: NEGATIVE
Protein, ur: NEGATIVE mg/dL
Specific Gravity, Urine: 1.01 (ref 1.005–1.030)
Urobilinogen, UA: 0.2 mg/dL (ref 0.0–1.0)
pH: 5.5 (ref 5.0–8.0)

## 2015-03-24 LAB — URINE MICROSCOPIC-ADD ON

## 2015-03-24 NOTE — Discharge Instructions (Signed)
Please follow-up with primary care provider, and endocrinologist tomorrow. Please inform them of your visit and all relevant information. Please return to the emergency room if any worsening signs or symptoms present

## 2015-06-02 ENCOUNTER — Other Ambulatory Visit: Payer: Self-pay

## 2015-06-02 DIAGNOSIS — Z1231 Encounter for screening mammogram for malignant neoplasm of breast: Secondary | ICD-10-CM

## 2015-06-10 ENCOUNTER — Ambulatory Visit
Admission: RE | Admit: 2015-06-10 | Discharge: 2015-06-10 | Disposition: A | Discharge status: Home / Self Care | Payer: BLUE CROSS/BLUE SHIELD | Source: Ambulatory Visit

## 2015-06-10 DIAGNOSIS — Z1231 Encounter for screening mammogram for malignant neoplasm of breast: Secondary | ICD-10-CM

## 2016-06-13 ENCOUNTER — Other Ambulatory Visit: Payer: Self-pay | Admitting: Obstetrics and Gynecology

## 2016-06-13 DIAGNOSIS — Z1231 Encounter for screening mammogram for malignant neoplasm of breast: Secondary | ICD-10-CM

## 2016-06-15 ENCOUNTER — Other Ambulatory Visit: Payer: Self-pay | Admitting: Obstetrics and Gynecology

## 2016-06-15 ENCOUNTER — Other Ambulatory Visit (HOSPITAL_COMMUNITY)
Admission: RE | Admit: 2016-06-15 | Discharge: 2016-06-15 | Disposition: A | Discharge status: Home / Self Care | Payer: Managed Care, Other (non HMO) | Source: Ambulatory Visit | Attending: Obstetrics and Gynecology | Admitting: Obstetrics and Gynecology

## 2016-06-15 DIAGNOSIS — Z01419 Encounter for gynecological examination (general) (routine) without abnormal findings: Secondary | ICD-10-CM | POA: Insufficient documentation

## 2016-06-15 DIAGNOSIS — Z1151 Encounter for screening for human papillomavirus (HPV): Secondary | ICD-10-CM | POA: Insufficient documentation

## 2016-06-16 LAB — CYTOLOGY - PAP

## 2016-07-03 ENCOUNTER — Ambulatory Visit
Admission: RE | Admit: 2016-07-03 | Discharge: 2016-07-03 | Disposition: A | Discharge status: Home / Self Care | Payer: PRIVATE HEALTH INSURANCE | Source: Ambulatory Visit | Attending: Obstetrics and Gynecology | Admitting: Obstetrics and Gynecology

## 2016-07-03 ENCOUNTER — Ambulatory Visit: Payer: Managed Care, Other (non HMO)

## 2016-07-03 DIAGNOSIS — Z1231 Encounter for screening mammogram for malignant neoplasm of breast: Secondary | ICD-10-CM

## 2016-07-24 ENCOUNTER — Ambulatory Visit: Payer: Managed Care, Other (non HMO)

## 2016-07-28 IMAGING — CT CT HEAD W/O CM
2 series · 17 of 30 positions shown, 20 images · non-contrast
Comparison: 12/04/2007

CLINICAL DATA: Syncope, nausea pole hypoxia. Recent change in
medications for Addison's disease.

EXAM:
CT HEAD WITHOUT CONTRAST
TECHNIQUE: Contiguous axial images were obtained from the base of the skull
through the vertex without intravenous contrast.

[Series 2: head w/o · axial · non-contrast · 0.41mm/px · z∈[-716,-596]mm · 9 of 31 slices shown, 12 images]
[im 4/31  brain]
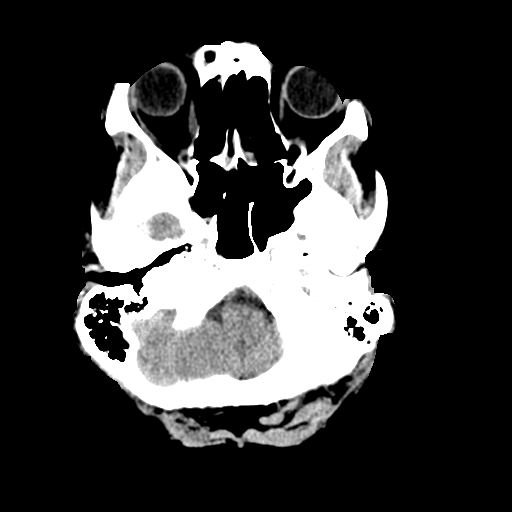
[im 4/31  bone]
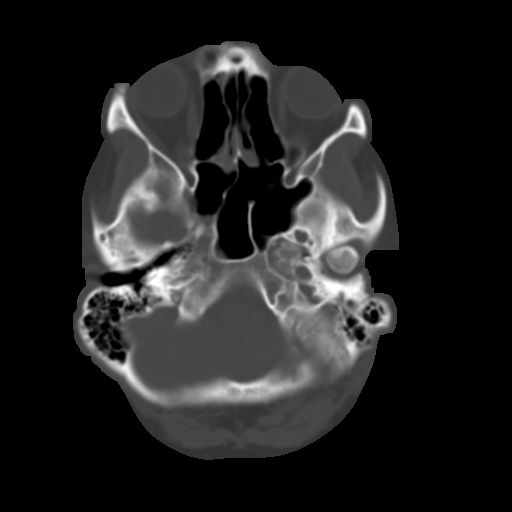
[im 7/31  brain]
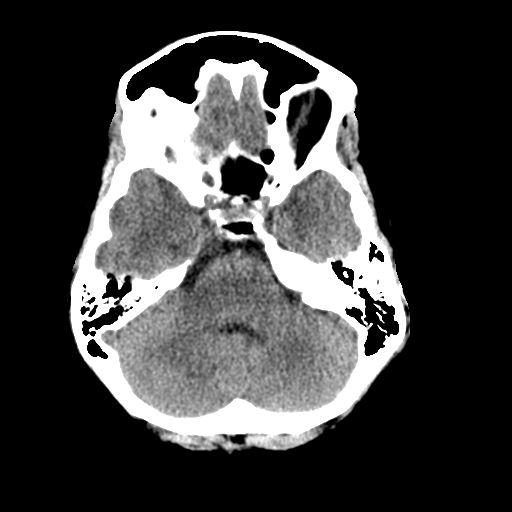
[im 10/31  brain]
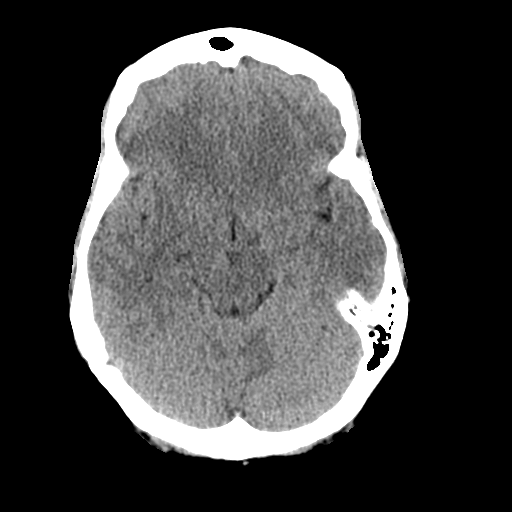
[im 13/31  brain]
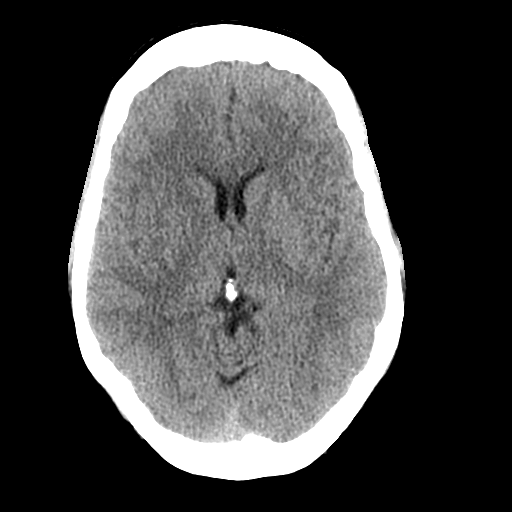
[im 16/31  brain]
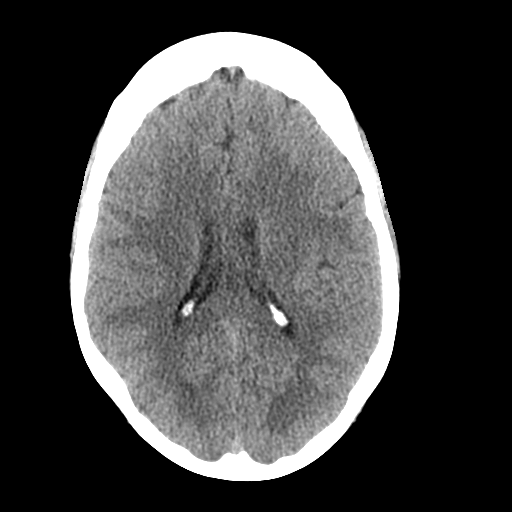
[im 16/31  bone]
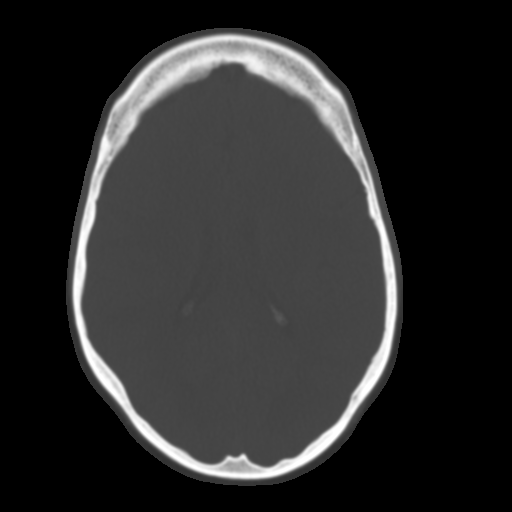
[im 19/31  brain]
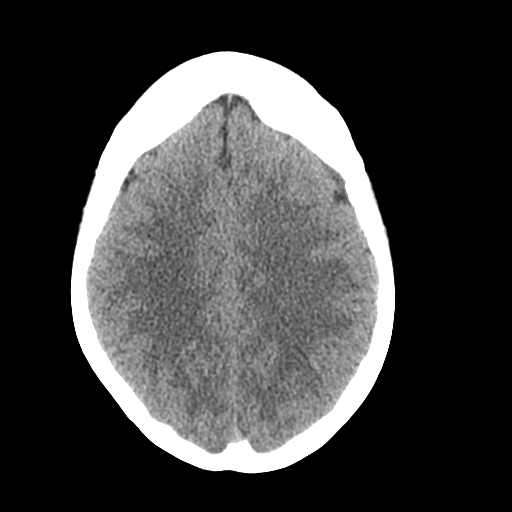
[im 22/31  brain]
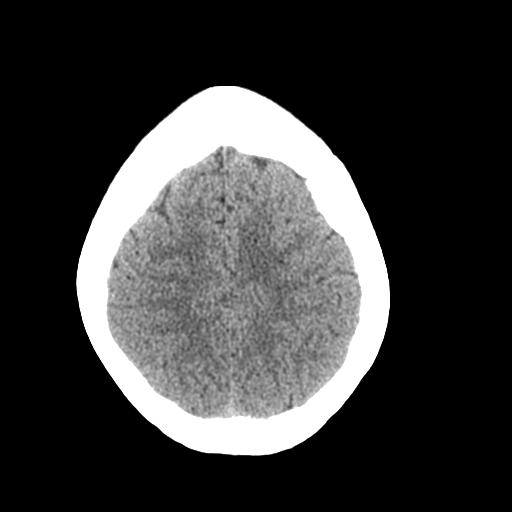
[im 25/31  brain]
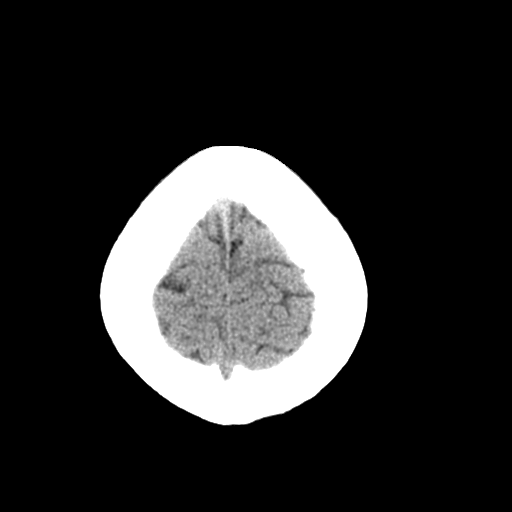
[im 28/31  brain]
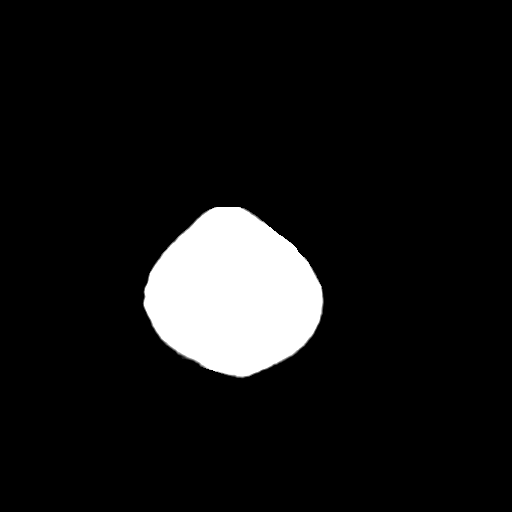
[im 28/31  bone]
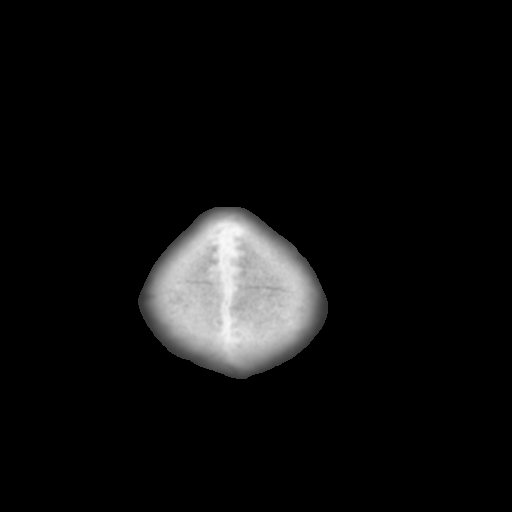

[Series 3: bone windows · axial · 0.41mm/px · z∈[-716,-599]mm · 8 of 51 slices shown]
[im 6/51  bone]
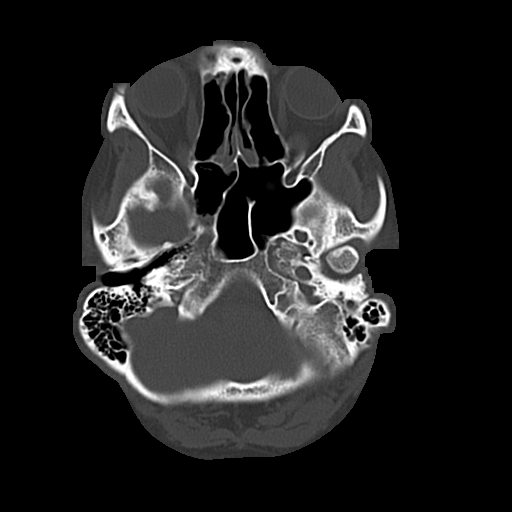
[im 12/51  bone]
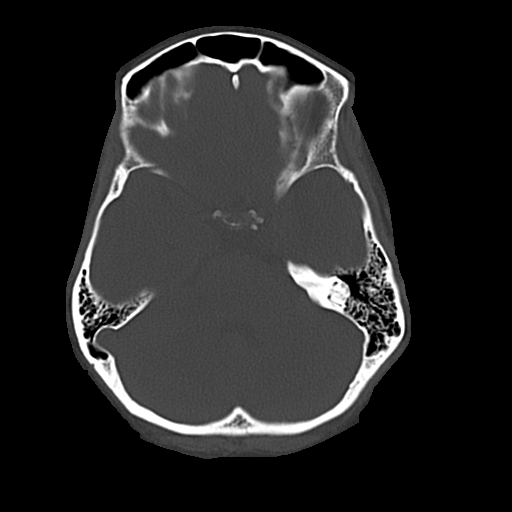
[im 17/51  bone]
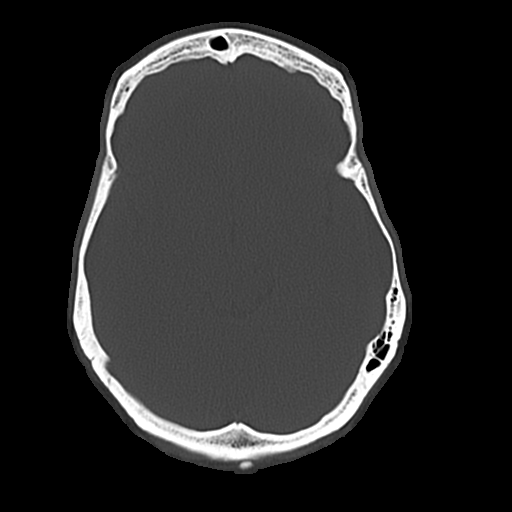
[im 23/51  bone]
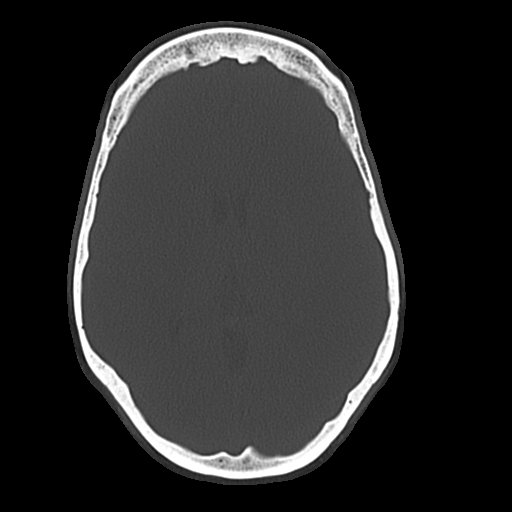
[im 28/51  bone]
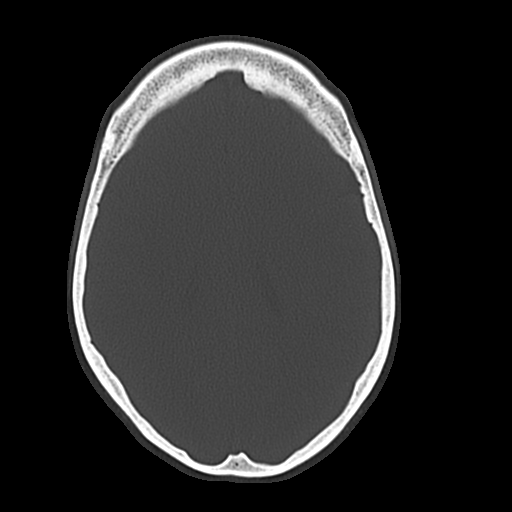
[im 34/51  bone]
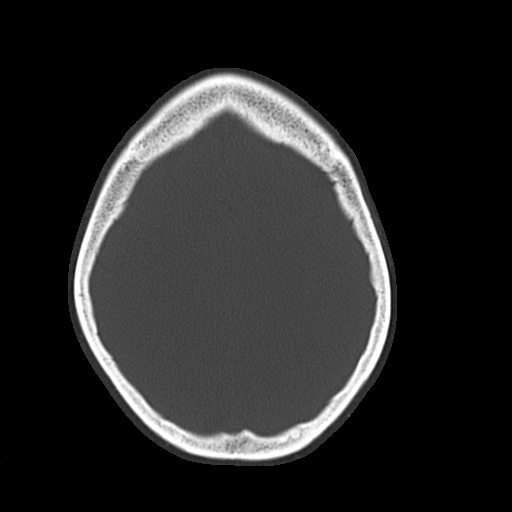
[im 39/51  bone]
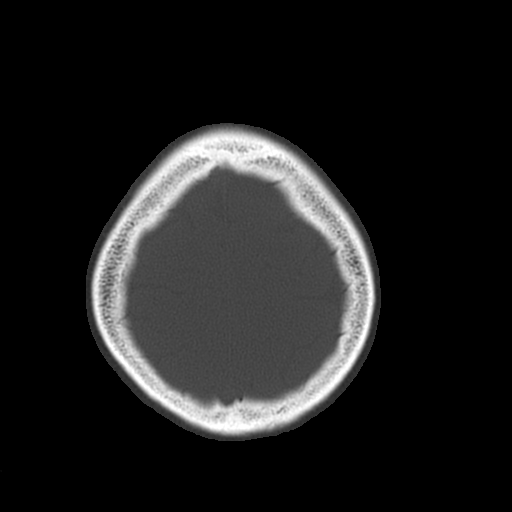
[im 45/51  bone]
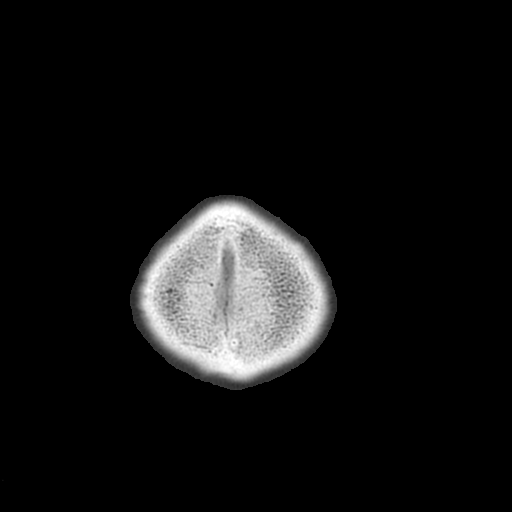

[17 of 30 positions shown; findings below may reference images not displayed]

FINDINGS: There is no intracranial hemorrhage, mass or evidence of acute
infarction. Gray matter and white matter appear normal. Brainstem
and posterior fossa are unremarkable. The ventricles and basal
cisterns appear normal.

The bony structures are intact. There is prior sinonasal surgery.
There is a right sphenoid sinus air-fluid level. There is mild
membrane thickening in the ethmoids.
IMPRESSION: Normal brain

Paranasal sinus disease, with right sphenoid sinus air-fluid level.

## 2017-01-24 ENCOUNTER — Other Ambulatory Visit: Payer: Self-pay | Admitting: Obstetrics and Gynecology

## 2017-03-09 ENCOUNTER — Emergency Department (HOSPITAL_COMMUNITY)
Admission: EM | Admit: 2017-03-09 | Discharge: 2017-03-09 | Disposition: A | Discharge status: Home / Self Care | Payer: PRIVATE HEALTH INSURANCE | Attending: Emergency Medicine | Admitting: Emergency Medicine

## 2017-03-09 ENCOUNTER — Emergency Department (HOSPITAL_COMMUNITY): Payer: PRIVATE HEALTH INSURANCE

## 2017-03-09 DIAGNOSIS — Z7982 Long term (current) use of aspirin: Secondary | ICD-10-CM | POA: Diagnosis not present

## 2017-03-09 DIAGNOSIS — Z91040 Latex allergy status: Secondary | ICD-10-CM | POA: Insufficient documentation

## 2017-03-09 DIAGNOSIS — R0789 Other chest pain: Secondary | ICD-10-CM

## 2017-03-09 DIAGNOSIS — J45909 Unspecified asthma, uncomplicated: Secondary | ICD-10-CM | POA: Diagnosis not present

## 2017-03-09 DIAGNOSIS — R55 Syncope and collapse: Secondary | ICD-10-CM | POA: Insufficient documentation

## 2017-03-09 LAB — BASIC METABOLIC PANEL
ANION GAP: 7 (ref 5–15)
BUN: 15 mg/dL (ref 6–20)
CHLORIDE: 107 mmol/L (ref 101–111)
CO2: 26 mmol/L (ref 22–32)
Calcium: 8.8 mg/dL — ABNORMAL LOW (ref 8.9–10.3)
Creatinine, Ser: 0.65 mg/dL (ref 0.44–1.00)
GFR calc non Af Amer: >60 mL/min (ref >60)
Glucose, Bld: 92 mg/dL (ref 65–99)
POTASSIUM: 3.8 mmol/L (ref 3.5–5.1)
Sodium: 140 mmol/L (ref 135–145)

## 2017-03-09 LAB — URINALYSIS, ROUTINE W REFLEX MICROSCOPIC
Bilirubin Urine: NEGATIVE
Glucose, UA: NEGATIVE mg/dL
Hgb urine dipstick: NEGATIVE
Ketones, ur: NEGATIVE mg/dL
LEUKOCYTES UA: NEGATIVE
Nitrite: NEGATIVE
PROTEIN: NEGATIVE mg/dL
SPECIFIC GRAVITY, URINE: 1.014 (ref 1.005–1.030)
pH: 6 (ref 5.0–8.0)

## 2017-03-09 LAB — PREGNANCY, URINE: Preg Test, Ur: NEGATIVE

## 2017-03-09 LAB — CBG MONITORING, ED: GLUCOSE-CAPILLARY: 84 mg/dL (ref 65–99)

## 2017-03-09 LAB — CBC
HEMATOCRIT: 36 % (ref 36.0–46.0)
Hemoglobin: 11.6 g/dL — ABNORMAL LOW (ref 12.0–15.0)
MCH: 30.8 pg (ref 26.0–34.0)
MCHC: 32.2 g/dL (ref 30.0–36.0)
MCV: 95.5 fL (ref 78.0–100.0)
PLATELETS: 183 10*3/uL (ref 150–400)
RBC: 3.77 MIL/uL — AB (ref 3.87–5.11)
RDW: 13 % (ref 11.5–15.5)
WBC: 6.7 10*3/uL (ref 4.0–10.5)

## 2017-03-09 LAB — I-STAT TROPONIN, ED
Troponin i, poc: 0 ng/mL (ref 0.00–0.08)
Troponin i, poc: 0 ng/mL (ref 0.00–0.08)

## 2017-03-09 NOTE — ED Triage Notes (Signed)
Arrival by EMS Onset one day ago developed intermittent chest pain and again today while at work with a syncope event lasting 30 seconds - 1 minute. Prior to arrival patient took aspirin 324 mg and 1 nitro.

## 2017-03-09 NOTE — ED Provider Notes (Signed)
[facility]-EMERGENCY DEPT Provider Note   CSN: [id number] Arrival date & time: 03/09/17  1130     History   Chief Complaint Chief Complaint  Patient presents with  . Chest Pain  . Loss of Consciousness    HPI Caitlyn Hale is a 46 y.o. female with history of migraines who presents with a one-day history of chest pain and syncopal episode that occurred today. Patient reports she has had intermittent sharp chest pain. She denies shortness of breath, but does state that the pain takes her breath away when it comes. It lasts 1-2 minutes. She has had some associated left neck and shoulder paresthesias with the pain. The pain is not pleuritic. Patient reports she went to work today and was feeling bad and went to the urgent care clinic close by. Patient had a syncopal episode. She did not hit her head. Patient was sent here for further evaluation. Patient was given nitroglycerin and aspirin at urgent care with some relief. Patient denies any abdominal pain, nausea, vomiting, urinary symptoms. Patient denies any recent long trips, surgeries, cancer, new leg swelling, smoking, exogenous estrogen use. Patient had a negative workup for similar symptoms at [facility] in February.  HPI  Past Medical History:  Diagnosis Date  . Addison's disease   . Asthma   . Hypoglycemia   . Migraines     There are no active problems to display for this patient.   Past Surgical History:  Procedure Laterality Date  . SINUSOTOMY    . TONSILLECTOMY      OB History    No data available       Home Medications    Prior to Admission medications   Medication Sig Start Date End Date Taking? Authorizing Provider  aspirin 325 MG EC tablet Take 325 mg by mouth daily.  03/09/17 03/09/17 Yes Historical Provider, MD  busPIRone (BUSPAR) 7.5 MG tablet Take 7.5 mg by mouth at bedtime. 02/01/17  Yes Historical Provider, MD  FLUoxetine (PROZAC) 20 MG tablet Take 20 mg by mouth at bedtime. 01/03/17  Yes Historical Provider, MD    SUMAtriptan (IMITREX) 25 MG tablet Take 1 tablet by mouth every 2 (two) hours as needed for migraine.  12/25/14  Yes Historical Provider, MD  albuterol (PROVENTIL HFA;VENTOLIN HFA) 108 (90 BASE) MCG/ACT inhaler Inhale 2 puffs into the lungs every 6 (six) hours as needed for wheezing or shortness of breath.  12/25/14   Historical Provider, MD    Family History No family history on file.  Social History Social History  Substance Use Topics  . Smoking status: Never Smoker  . Smokeless tobacco: Not on file  . Alcohol use No     Allergies   Latex; Alcohol-sulfur [sulfur]; Benadryl [diphenhydramine hcl]; and Ibuprofen   Review of Systems Review of Systems  Constitutional: Negative for chills and fever.  HENT: Negative for facial swelling and sore throat.   Respiratory: Negative for shortness of breath.   Cardiovascular: Positive for chest pain.  Gastrointestinal: Negative for abdominal pain, nausea and vomiting.  Genitourinary: Negative for dysuria.  Musculoskeletal: Negative for back pain.  Skin: Negative for rash and wound.  Neurological: Positive for syncope and headaches.  Psychiatric/Behavioral: The patient is not nervous/anxious.      Physical Exam Updated Vital Signs BP 121/74   Pulse 70   Temp 98.3 F (36.8 C) (Oral)   Resp 17   Ht 5\' 2"  (1.575 m)   Wt 74.4 kg   LMP 11/06/2016 Comment: Abnormal per patient  11/2016  SpO2 97%   BMI 30.00 kg/m   Physical Exam  Constitutional: She appears well-developed and well-nourished. No distress.  HENT:  Head: Normocephalic and atraumatic.  Mouth/Throat: Oropharynx is clear and moist. No oropharyngeal exudate.  Eyes: Conjunctivae and EOM are normal. Pupils are equal, round, and reactive to light. Right eye exhibits no discharge. Left eye exhibits no discharge. No scleral icterus.  Neck: Normal range of motion. Neck supple. No thyromegaly present.  Cardiovascular: Normal rate, regular rhythm, normal heart sounds and intact  distal pulses.  Exam reveals no gallop and no friction rub.   No murmur heard. Pulmonary/Chest: Effort normal and breath sounds normal. No stridor. No respiratory distress. She has no wheezes. She has no rales.  Abdominal: Soft. Bowel sounds are normal. She exhibits no distension. There is no tenderness. There is no rebound and no guarding.  Musculoskeletal: She exhibits no edema.  Lymphadenopathy:    She has no cervical adenopathy.  Neurological: She is alert. Coordination normal.  CN 3-12 intact; normal sensation throughout; 5/5 strength in all 4 extremities; equal bilateral grip strength; no ataxia on finger to nose  Skin: Skin is warm and dry. No rash noted. She is not diaphoretic. No pallor.  Psychiatric: She has a normal mood and affect.  Nursing note and vitals reviewed.    ED Treatments / Results  Labs (all labs ordered are listed, but only abnormal results are displayed) Labs Reviewed  BASIC METABOLIC PANEL - Abnormal; Notable for the following:       Result Value   Calcium 8.8 (*)    All other components within normal limits  CBC - Abnormal; Notable for the following:    RBC 3.77 (*)    Hemoglobin 11.6 (*)    All other components within normal limits  URINALYSIS, ROUTINE W REFLEX MICROSCOPIC - Abnormal; Notable for the following:    Color, Urine STRAW (*)    All other components within normal limits  PREGNANCY, URINE  CBG MONITORING, ED  Rosezena SensorI-STAT TROPOININ, ED  I-STAT TROPOININ, ED    EKG  EKG Interpretation  Date/Time:  Friday Mar 09 2017 11:36:10 EDT Ventricular Rate:  77 PR Interval:    QRS Duration: 100 QT Interval:  372 QTC Calculation: 421 R Axis:   41 Text Interpretation:  Sinus rhythm Normal ECG Confirmed by Gerhard MunchLOCKWOOD, ROBERT  MD ([id number]) on 03/09/2017 12:17:53 PM       Radiology Dg Chest 2 View  Result Date: 03/09/2017 CLINICAL DATA:  Pt reports syncopal episode today while at work with associated left sided sharp chest pains. Pt has hx of same  symptoms in February of this year with no diagnosis. EXAM: CHEST  2 VIEW COMPARISON:  11/23/2008 FINDINGS: Midline trachea.  Normal heart size and mediastinal contours. Sharp costophrenic angles.  No pneumothorax.  Clear lungs. Numerous leads and wires project over the chest. IMPRESSION: No active cardiopulmonary disease. Electronically Signed   By: Jeronimo GreavesKyle  Talbot M.D.   On: 03/09/2017 12:58    Procedures Procedures (including critical care time)  Medications Ordered in ED Medications - No data to display   Initial Impression / Assessment and Plan / ED Course  I have reviewed the triage vital signs and the nursing notes.  Pertinent labs & imaging results that were available during my care of the patient were reviewed by me and considered in my medical decision making (see chart for details).     Patient with atypical chest pain and syncopal episode. PERC negative. Low suspicion  for ACS. CBC shows hemoglobin 11.6. BMP unremarkable. Delta troponin negative. CXR shows no active cardiopulmonary disease. UA negative. EKG shows NSR.Follow-up to PCP and cardiologist. Strict return precautions discussed. Patient understands and agrees with plan. Patient vitals stable throughout ED course and discharged in satisfactory condition. I discussed patient case with Dr. Jeraldine Loots who guided the patient's management and agrees with plan.  Final Clinical Impressions(s) / ED Diagnoses   Final diagnoses:  Atypical chest pain  Syncope and collapse    New Prescriptions New Prescriptions   No medications on file     Emi Holes, PA-C 03/09/17 1611    Gerhard Munch, MD 03/13/17 [phone]

## 2017-03-09 NOTE — ED Notes (Signed)
Returned from radiology. 

## 2017-03-09 NOTE — Discharge Instructions (Signed)
Make sure to stay hydrated. Please follow up with your primary care provider and your cardiologist for further evaluation and treatment of today's episode. You can take tylenol and/or ibuprofen as prescribed over-the-counter for your pain. Please return to the emergency department if you develop any new or worsening symptoms.

## 2017-03-09 NOTE — ED Notes (Signed)
Patient verbalized understanding of discharge instructions and denies any further needs or questions at this time. VS stable. Patient ambulatory with steady gait. RN escorted to ED entrance.   

## 2017-07-23 ENCOUNTER — Emergency Department (HOSPITAL_BASED_OUTPATIENT_CLINIC_OR_DEPARTMENT_OTHER)
Admission: EM | Admit: 2017-07-23 | Discharge: 2017-07-23 | Disposition: A | Discharge status: Home / Self Care | Payer: BLUE CROSS/BLUE SHIELD | Attending: Emergency Medicine | Admitting: Emergency Medicine

## 2017-07-23 ENCOUNTER — Encounter (HOSPITAL_BASED_OUTPATIENT_CLINIC_OR_DEPARTMENT_OTHER): Payer: Self-pay

## 2017-07-23 DIAGNOSIS — Z79899 Other long term (current) drug therapy: Secondary | ICD-10-CM | POA: Diagnosis not present

## 2017-07-23 DIAGNOSIS — J45909 Unspecified asthma, uncomplicated: Secondary | ICD-10-CM | POA: Diagnosis not present

## 2017-07-23 DIAGNOSIS — Z91040 Latex allergy status: Secondary | ICD-10-CM | POA: Insufficient documentation

## 2017-07-23 DIAGNOSIS — H8112 Benign paroxysmal vertigo, left ear: Secondary | ICD-10-CM | POA: Diagnosis not present

## 2017-07-23 DIAGNOSIS — R42 Dizziness and giddiness: Secondary | ICD-10-CM | POA: Diagnosis present

## 2017-07-23 MED ORDER — ONDANSETRON HCL 8 MG PO TABS
8.0000 mg | ORAL_TABLET | Freq: Once | ORAL | Status: AC
  Administered 2017-07-23: 8 mg via ORAL
  Filled 2017-07-23: qty 1

## 2017-07-23 MED ORDER — MECLIZINE HCL 12.5 MG PO TABS: 12.5000 mg | ORAL_TABLET | Freq: Three times a day (TID) | ORAL | 0 refills | Status: AC | PRN

## 2017-07-23 NOTE — Discharge Instructions (Addendum)
You have signs and symptoms of BPPV. Please return to the ER if you have new or worsening symptoms of uncontrollable vomiting, vision changes, numbness, weakness, trouble speaking.  Please follow instructions attached and perform Epley Maneuver 5 times a day.   Schedule an appointment and follow up with ENT doctor

## 2017-07-23 NOTE — ED Triage Notes (Signed)
C/o dizziness started 7pm-states feels like vertigo-NAD-presents in w/c

## 2017-07-23 NOTE — ED Notes (Signed)
ED Provider at bedside. 

## 2017-07-23 NOTE — ED Provider Notes (Signed)
[facility] Provider [facility]   CSN: [id number] Arrival date & time: 07/23/17  1658     History   Chief Complaint Chief Complaint  Patient presents with  . Dizziness    HPI Caitlyn Hale is a 46 y.o. female.  HPI  Caitlyn Hale is a 46 year old female with a history of migraines, Addison's disease (currently not on steroid), asthma who presents to the emergency department for evaluation of dizziness over the last 24 hours. Patient states that starting at about 7 PM yesterday she felt as if the room was spinning around her every time she turned her head, stood up, or sat down. She also endorses nausea at these times. She states that when she sits stationary she does not feel dizzy. Denies numbness, weakness, speech difficulty, vision changes, hearing loss, abdominal pain, fever, shortness of breath, chest pain. She states that she had symptoms like this several years ago following a sinus surgery.  Past Medical History:  Diagnosis Date  . Addison's disease (HCC)   . Asthma   . Hypoglycemia   . Migraines     There are no active problems to display for this patient.   Past Surgical History:  Procedure Laterality Date  . SINUSOTOMY    . TONSILLECTOMY      OB History    No data available       Home Medications    Prior to Admission medications   Medication Sig Start Date End Date Taking? Authorizing Provider  albuterol (PROVENTIL HFA;VENTOLIN HFA) 108 (90 BASE) MCG/ACT inhaler Inhale 2 puffs into the lungs every 6 (six) hours as needed for wheezing or shortness of breath.  12/25/14   [provider]  busPIRone (BUSPAR) 7.5 MG tablet Take 7.5 mg by mouth at bedtime. 02/01/17   [provider]  FLUoxetine (PROZAC) 20 MG tablet Take 20 mg by mouth at bedtime. 01/03/17   [provider]  SUMAtriptan (IMITREX) 25 MG tablet Take 1 tablet by mouth every 2 (two) hours as needed for migraine.  12/25/14   [provider]    Family History No  family history on file.  Social History Social History  Substance Use Topics  . Smoking status: Never Smoker  . Smokeless tobacco: Never Used  . Alcohol use No     Allergies   Latex; Alcohol-sulfur [sulfur]; Benadryl [diphenhydramine hcl]; and Ibuprofen   Review of Systems Review of Systems  Constitutional: Negative for chills, fatigue, fever and unexpected weight change.  HENT: Negative for ear pain, hearing loss, rhinorrhea, sore throat, trouble swallowing and voice change.   Eyes: Negative for pain and visual disturbance.  Respiratory: Negative for shortness of breath.   Cardiovascular: Negative for chest pain.  Gastrointestinal: Positive for nausea. Negative for abdominal pain and vomiting.  Genitourinary: Negative for dysuria.  Musculoskeletal: Negative for neck pain.  Skin: Negative for rash.  Neurological: Positive for dizziness. Negative for facial asymmetry, speech difficulty, weakness, light-headedness, numbness and headaches.     Physical Exam Updated Vital Signs BP 126/90 (BP Location: Right Arm)   Pulse 65   Temp 98 F (36.7 C) (Oral)   Resp 18   Ht  (1.575 m)   Wt 74.4 kg (164 lb)   SpO2 100%   BMI 30.00 kg/m   Physical Exam  Constitutional: She is oriented to person, place, and time. She appears well-developed and well-nourished. No distress.  HENT:  Head: Normocephalic and atraumatic. Head contusion: Dix-Hallpike test positive on the left.  Mouth/Throat: Oropharynx is clear and moist. No oropharyngeal exudate.  Eyes: Pupils are equal, round, and reactive to light. Conjunctivae and EOM are normal. Right eye exhibits no discharge. Left eye exhibits no discharge.  Neck: Normal range of motion. Neck supple.  Cardiovascular: Normal rate and regular rhythm.  Exam reveals no friction rub.   No murmur heard. Pulmonary/Chest: Effort normal. No respiratory distress. She has no wheezes. She has no rales.  Abdominal: Soft. Bowel sounds are normal. There  is no tenderness. There is no guarding.  Lymphadenopathy:    She has no cervical adenopathy.  Neurological: She is alert and oriented to person, place, and time. Coordination normal.  Mental Status:  Alert, oriented, thought content appropriate, able to give a coherent history. Speech fluent without evidence of aphasia. Able to follow 2 step commands without difficulty.  Cranial Nerves:  II:  Peripheral visual fields grossly normal, pupils equal, round, reactive to light III,IV, VI: ptosis not present, extra-ocular motions intact bilaterally  V,VII: smile symmetric, facial light touch sensation equal VIII: hearing grossly normal to voice  X: uvula elevates symmetrically  XI: bilateral shoulder shrug symmetric and strong XII: midline tongue extension without fassiculations Motor:  Normal tone. 5/5 in upper and lower extremities bilaterally including strong and equal grip strength and dorsiflexion/plantar flexion Sensory: Pinprick and light touch normal in all extremities.  Deep Tendon Reflexes: 2+ and symmetric in the biceps and patella Cerebellar: normal finger-to-nose with bilateral upper extremities Gait: normal gait and balance CV: distal pulses palpable throughout   Skin: Skin is warm and dry. Capillary refill takes less than 2 seconds. No rash noted. She is not diaphoretic. No pallor.  Psychiatric: She has a normal mood and affect. Her behavior is normal.  Nursing note and vitals reviewed.    ED Treatments / Results  Labs (all labs ordered are listed, but only abnormal results are displayed) Labs Reviewed - No data to display  EKG  EKG Interpretation None       Radiology No results found.  Procedures Procedures (including critical care time)  Medications Ordered in ED Medications  ondansetron (ZOFRAN) tablet 8 mg (not administered)     Initial Impression / Assessment and Plan / ED Course  I have reviewed the triage vital signs and the nursing  notes.  Pertinent labs & imaging results that were available during my care of the patient were reviewed by me and considered in my medical decision making (see chart for details).    Patient with positive Dix-Hallpike test for BPPV. No vertical or rotational nystagmus. Patient normal finger-nose and normal gait. No slurred speech or weakness.  Doubt CVA or other central cause of vertigo given these exam findings. History and physical consistent with peripheral vertigo symptoms. Have given her Epley maneuver exercises and discharged home with meclizine. Patient instructed to followup with her ENT for further evaluation.   On recheck, patient is able to tolerate PO water at the bedside and nausea is under control. She is to return to the emergency department for new neurologic symptoms, loss of vision or other concerning symptoms.  Final Clinical Impressions(s) / ED Diagnoses   Final diagnoses:  Benign paroxysmal positional vertigo of left ear    New Prescriptions New Prescriptions   No medications on file     Lawrence Marseilles 07/23/17 1846    Lavera Guise, MD 07/24/17 [phone]

## 2017-08-07 ENCOUNTER — Other Ambulatory Visit: Payer: Self-pay | Admitting: Obstetrics and Gynecology

## 2017-08-07 DIAGNOSIS — Z1231 Encounter for screening mammogram for malignant neoplasm of breast: Secondary | ICD-10-CM

## 2017-08-08 ENCOUNTER — Ambulatory Visit
Admission: RE | Admit: 2017-08-08 | Discharge: 2017-08-08 | Disposition: A | Discharge status: Home / Self Care | Payer: BLUE CROSS/BLUE SHIELD | Source: Ambulatory Visit | Attending: Obstetrics and Gynecology | Admitting: Obstetrics and Gynecology

## 2017-08-08 DIAGNOSIS — Z1231 Encounter for screening mammogram for malignant neoplasm of breast: Secondary | ICD-10-CM

## 2018-07-09 ENCOUNTER — Other Ambulatory Visit: Payer: Self-pay | Admitting: Obstetrics and Gynecology

## 2018-07-09 DIAGNOSIS — Z1231 Encounter for screening mammogram for malignant neoplasm of breast: Secondary | ICD-10-CM

## 2018-08-15 ENCOUNTER — Ambulatory Visit: Payer: BLUE CROSS/BLUE SHIELD

## 2018-09-09 ENCOUNTER — Ambulatory Visit: Payer: BLUE CROSS/BLUE SHIELD

## 2018-09-26 ENCOUNTER — Ambulatory Visit
Admission: RE | Admit: 2018-09-26 | Discharge: 2018-09-26 | Disposition: A | Discharge status: Home / Self Care | Payer: Federal, State, Local not specified - PPO | Source: Ambulatory Visit | Attending: Obstetrics and Gynecology | Admitting: Obstetrics and Gynecology

## 2018-09-26 DIAGNOSIS — Z1231 Encounter for screening mammogram for malignant neoplasm of breast: Secondary | ICD-10-CM

## 2018-11-04 ENCOUNTER — Ambulatory Visit: Payer: Self-pay

## 2019-08-29 ENCOUNTER — Other Ambulatory Visit: Payer: Self-pay | Admitting: Obstetrics and Gynecology

## 2019-08-29 ENCOUNTER — Other Ambulatory Visit (HOSPITAL_COMMUNITY)
Admission: RE | Admit: 2019-08-29 | Discharge: 2019-08-29 | Disposition: A | Discharge status: Home / Self Care | Payer: PRIVATE HEALTH INSURANCE | Source: Ambulatory Visit | Attending: Obstetrics and Gynecology | Admitting: Obstetrics and Gynecology

## 2019-08-29 DIAGNOSIS — Z01419 Encounter for gynecological examination (general) (routine) without abnormal findings: Secondary | ICD-10-CM | POA: Insufficient documentation

## 2019-09-02 LAB — CYTOLOGY - PAP
Comment: NEGATIVE
Diagnosis: NEGATIVE
High risk HPV: NEGATIVE

## 2019-10-06 ENCOUNTER — Other Ambulatory Visit: Payer: Self-pay | Admitting: Obstetrics and Gynecology

## 2019-10-06 DIAGNOSIS — Z1231 Encounter for screening mammogram for malignant neoplasm of breast: Secondary | ICD-10-CM

## 2019-10-16 ENCOUNTER — Ambulatory Visit: Payer: PRIVATE HEALTH INSURANCE

## 2019-11-19 ENCOUNTER — Ambulatory Visit
Admission: RE | Admit: 2019-11-19 | Discharge: 2019-11-19 | Disposition: A | Discharge status: Home / Self Care | Payer: PRIVATE HEALTH INSURANCE | Source: Ambulatory Visit | Attending: Obstetrics and Gynecology | Admitting: Obstetrics and Gynecology

## 2019-11-19 ENCOUNTER — Other Ambulatory Visit: Payer: Self-pay

## 2019-11-19 DIAGNOSIS — Z1231 Encounter for screening mammogram for malignant neoplasm of breast: Secondary | ICD-10-CM

## 2019-11-29 ENCOUNTER — Emergency Department (HOSPITAL_BASED_OUTPATIENT_CLINIC_OR_DEPARTMENT_OTHER)
Admission: EM | Admit: 2019-11-29 | Discharge: 2019-11-29 | Disposition: A | Discharge status: Home / Self Care | Payer: PRIVATE HEALTH INSURANCE | Attending: Emergency Medicine | Admitting: Emergency Medicine

## 2019-11-29 ENCOUNTER — Emergency Department (HOSPITAL_BASED_OUTPATIENT_CLINIC_OR_DEPARTMENT_OTHER): Payer: PRIVATE HEALTH INSURANCE

## 2019-11-29 ENCOUNTER — Encounter (HOSPITAL_BASED_OUTPATIENT_CLINIC_OR_DEPARTMENT_OTHER): Payer: Self-pay | Admitting: Emergency Medicine

## 2019-11-29 ENCOUNTER — Other Ambulatory Visit: Payer: Self-pay

## 2019-11-29 DIAGNOSIS — Z20822 Contact with and (suspected) exposure to covid-19: Secondary | ICD-10-CM | POA: Diagnosis not present

## 2019-11-29 DIAGNOSIS — Z79899 Other long term (current) drug therapy: Secondary | ICD-10-CM | POA: Diagnosis not present

## 2019-11-29 DIAGNOSIS — J4520 Mild intermittent asthma, uncomplicated: Secondary | ICD-10-CM | POA: Diagnosis not present

## 2019-11-29 DIAGNOSIS — Z91040 Latex allergy status: Secondary | ICD-10-CM | POA: Diagnosis not present

## 2019-11-29 DIAGNOSIS — R0602 Shortness of breath: Secondary | ICD-10-CM | POA: Diagnosis present

## 2019-11-29 LAB — COMPREHENSIVE METABOLIC PANEL
ALT: 19 U/L (ref 0–44)
AST: 20 U/L (ref 15–41)
Albumin: 4.2 g/dL (ref 3.5–5.0)
Alkaline Phosphatase: 69 U/L (ref 38–126)
Anion gap: 7 (ref 5–15)
BUN: 20 mg/dL (ref 6–20)
CO2: 26 mmol/L (ref 22–32)
Calcium: 9.4 mg/dL (ref 8.9–10.3)
Chloride: 105 mmol/L (ref 98–111)
Creatinine, Ser: 0.67 mg/dL (ref 0.44–1.00)
GFR calc Af Amer: >60 mL/min (ref >60)
GFR calc non Af Amer: >60 mL/min (ref >60)
Glucose, Bld: 110 mg/dL — ABNORMAL HIGH (ref 70–99)
Potassium: 4.1 mmol/L (ref 3.5–5.1)
Sodium: 138 mmol/L (ref 135–145)
Total Bilirubin: 0.8 mg/dL (ref 0.3–1.2)
Total Protein: 7.6 g/dL (ref 6.5–8.1)

## 2019-11-29 LAB — CBC WITH DIFFERENTIAL/PLATELET
Abs Immature Granulocytes: 0.03 10*3/uL (ref 0.00–0.07)
Basophils Absolute: 0 10*3/uL (ref 0.0–0.1)
Basophils Relative: 0 %
Eosinophils Absolute: 0.9 10*3/uL — ABNORMAL HIGH (ref 0.0–0.5)
Eosinophils Relative: 10 %
HCT: 40.6 % (ref 36.0–46.0)
Hemoglobin: 12.8 g/dL (ref 12.0–15.0)
Immature Granulocytes: 0 %
Lymphocytes Relative: 38 %
Lymphs Abs: 3.6 10*3/uL (ref 0.7–4.0)
MCH: 30 pg (ref 26.0–34.0)
MCHC: 31.5 g/dL (ref 30.0–36.0)
MCV: 95.1 fL (ref 80.0–100.0)
Monocytes Absolute: 0.8 10*3/uL (ref 0.1–1.0)
Monocytes Relative: 9 %
Neutro Abs: 4.1 10*3/uL (ref 1.7–7.7)
Neutrophils Relative %: 43 %
Platelets: 253 10*3/uL (ref 150–400)
RBC: 4.27 MIL/uL (ref 3.87–5.11)
RDW: 13 % (ref 11.5–15.5)
WBC: 9.5 10*3/uL (ref 4.0–10.5)
nRBC: 0 % (ref 0.0–0.2)

## 2019-11-29 LAB — SARS CORONAVIRUS 2 BY RT PCR (HOSPITAL ORDER, PERFORMED IN ~~LOC~~ HOSPITAL LAB): SARS Coronavirus 2: NEGATIVE

## 2019-11-29 LAB — TROPONIN I (HIGH SENSITIVITY): Troponin I (High Sensitivity): <2 ng/L (ref <18)

## 2019-11-29 LAB — LIPASE, BLOOD: Lipase: 32 U/L (ref 11–51)

## 2019-11-29 LAB — D-DIMER, QUANTITATIVE: D-Dimer, Quant: 0.33 ug/mL-FEU (ref 0.00–0.50)

## 2019-11-29 LAB — PREGNANCY, URINE: Preg Test, Ur: NEGATIVE

## 2019-11-29 MED ORDER — ALBUTEROL SULFATE HFA 108 (90 BASE) MCG/ACT IN AERS
6.0000 | INHALATION_SPRAY | Freq: Once | RESPIRATORY_TRACT | Status: AC
  Administered 2019-11-29: 6 via RESPIRATORY_TRACT
  Filled 2019-11-29: qty 6.7

## 2019-11-29 MED ORDER — METHYLPREDNISOLONE SODIUM SUCC 125 MG IJ SOLR
125.0000 mg | Freq: Once | INTRAMUSCULAR | Status: AC
Start: 2019-11-29 — End: 2019-11-29
  Administered 2019-11-29: 125 mg via INTRAVENOUS
  Filled 2019-11-29: qty 2

## 2019-11-29 MED ORDER — PREDNISONE 20 MG PO TABS: 40.0000 mg | ORAL_TABLET | Freq: Every day | ORAL | 0 refills | Status: AC

## 2019-11-29 MED ORDER — IOHEXOL 350 MG/ML SOLN
100.0000 mL | Freq: Once | INTRAVENOUS | Status: AC | PRN
  Administered 2019-11-29: 100 mL via INTRAVENOUS

## 2019-11-29 NOTE — ED Notes (Signed)
Pt verbalizes that breathing is feeling easier, reports ability to take a deep breath.

## 2019-11-29 NOTE — ED Triage Notes (Signed)
Reports SOB started 1/9.  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  Tested on 1/10 and was negative.  Continues to be sob using inhaler and nebs. Completed prednisone 1/17.

## 2019-11-29 NOTE — ED Notes (Signed)
ED Provider at bedside. 

## 2019-11-29 NOTE — ED Provider Notes (Signed)
[facility] EMERGENCY DEPARTMENT Provider Note   CSN: [id number] Arrival date & time: 11/29/19  1948     History Chief Complaint  Patient presents with   Shortness of Breath    Caitlyn Hale is a 49 y.o. female.  The history is provided by the patient.  Shortness of Breath Severity:  Moderate Onset quality:  Gradual Duration:  2 weeks Timing:  Intermittent Progression:  Waxing and waning Chronicity:  New Context: URI (asthma type symptoms with two negative covid tests recently. Finished course of steroids a while ago and still feels SOB. Works in a covid unit. )   Relieved by:  Inhaler Worsened by:  Exertion Ineffective treatments:  Inhaler Associated symptoms: cough and wheezing   Associated symptoms: no abdominal pain, no chest pain, no ear pain, no fever, no rash, no sore throat and no vomiting        Past Medical History:  Diagnosis Date   Addison's disease ([place])    Asthma    Hypoglycemia    Migraines     There are no problems to display for this patient.   Past Surgical History:  Procedure Laterality Date   SINUSOTOMY     TONSILLECTOMY       OB History   No obstetric history on file.     Family History  Problem Relation Age of Onset   Breast cancer Neg Hx     Social History   Tobacco Use   Smoking status: Never Smoker   Smokeless tobacco: Never Used  Substance Use Topics   Alcohol use: No   Drug use: No    Home Medications Prior to Admission medications   Medication Sig Start Date End Date Taking? Authorizing Provider  albuterol (PROVENTIL HFA;VENTOLIN HFA) 108 (90 BASE) MCG/ACT inhaler Inhale 2 puffs into the lungs every 6 (six) hours as needed for wheezing or shortness of breath.  12/25/14  Yes [provider]  busPIRone (BUSPAR) 7.5 MG tablet Take 7.5 mg by mouth at bedtime. 02/01/17  Yes [provider]  FLUoxetine (PROZAC) 20 MG tablet Take 20 mg by mouth at bedtime. 01/03/17  Yes [provider]  rOPINIRole (REQUIP) 0.5 MG tablet Take 1 mg by mouth at bedtime.   Yes [provider]  SUMAtriptan (IMITREX) 25 MG tablet Take 1 tablet by mouth every 2 (two) hours as needed for migraine.  12/25/14  Yes [provider]  meclizine (ANTIVERT) 12.5 MG tablet Take 1 tablet (12.5 mg total) by mouth 3 (three) times daily as needed for dizziness. 07/23/17   Glyn Ade, PA-C    Allergies    Latex, Alcohol-sulfur [sulfur], Benadryl [diphenhydramine hcl], and Ibuprofen  Review of Systems   Review of Systems  Constitutional: Negative for chills and fever.  HENT: Negative for ear pain and sore throat.   Eyes: Negative for pain and visual disturbance.  Respiratory: Positive for cough, shortness of breath and wheezing.   Cardiovascular: Negative for chest pain and palpitations.  Gastrointestinal: Negative for abdominal pain and vomiting.  Genitourinary: Negative for dysuria and hematuria.  Musculoskeletal: Negative for arthralgias and back pain.  Skin: Negative for color change and rash.  Neurological: Negative for seizures and syncope.  All other systems reviewed and are negative.   Physical Exam Updated Vital Signs  ED Triage Vitals  Enc Vitals Group     BP --      Pulse Rate 11/29/19 1955 (!) 112     Resp 11/29/19 1955 (!)  22     Temp 11/29/19 1955 97.8 F (36.6 C)     Temp Source 11/29/19 1955 Oral     SpO2 11/29/19 1955 98 %     Weight 11/29/19 1957 164 lb 0.4 oz (74.4 kg)     Height 11/29/19 1957 5\' 3"  (1.6 m)     Head Circumference --      Peak Flow --      Pain Score 11/29/19 1957 7     Pain Loc --      Pain Edu? --      Excl. in GC? --     Physical Exam Vitals and nursing note reviewed.  Constitutional:      General: She is not in acute distress.    Appearance: She is well-developed.  HENT:     Head: Normocephalic and atraumatic.  Eyes:     Conjunctiva/sclera: Conjunctivae normal.     Pupils: Pupils are equal, round, and  reactive to light.  Cardiovascular:     Rate and Rhythm: Normal rate and regular rhythm.     Pulses: Normal pulses.     Heart sounds: Normal heart sounds. No murmur.  Pulmonary:     Effort: Pulmonary effort is normal. Tachypnea present. No respiratory distress.     Breath sounds: Wheezing (mild, scattered) present.  Abdominal:     Palpations: Abdomen is soft.     Tenderness: There is no abdominal tenderness.  Musculoskeletal:        General: Normal range of motion.     Cervical back: Neck supple.     Right lower leg: No edema.     Left lower leg: No edema.  Skin:    General: Skin is warm and dry.     Capillary Refill: Capillary refill takes less than 2 seconds.  Neurological:     General: No focal deficit present.     Mental Status: She is alert.  Psychiatric:        Mood and Affect: Mood normal.     ED Results / Procedures / Treatments   Labs (all labs ordered are listed, but only abnormal results are displayed) Labs Reviewed  CBC WITH DIFFERENTIAL/PLATELET - Abnormal; Notable for the following components:      Result Value   Eosinophils Absolute 0.9 (*)    All other components within normal limits  COMPREHENSIVE METABOLIC PANEL - Abnormal; Notable for the following components:   Glucose, Bld 110 (*)    All other components within normal limits  SARS CORONAVIRUS 2 BY RT PCR (HOSPITAL ORDER, PERFORMED IN [facility])  LIPASE, BLOOD  PREGNANCY, URINE  D-DIMER, QUANTITATIVE (NOT AT [facility])  TROPONIN I (HIGH SENSITIVITY)    EKG EKG Interpretation  Date/Time:  Saturday November 29 2019 20:00:48 EST Ventricular Rate:  101 PR Interval:    QRS Duration: 95 QT Interval:  356 QTC Calculation: 464 R Axis:   -116 Text Interpretation: Sinus tachycardia Left anterior fascicular block Abnormal R-wave progression, late transition Abnormal lateral Q waves ST depression, consider ischemia, lateral lds ST elevation, consider inferior injury Confirmed by [id number]  [phone]) on 11/29/2019 8:04:49 PM   Radiology CT Angio Chest PE W and/or Wo Contrast  Result Date: 11/29/2019 CLINICAL DATA:  Shortness of breath. Negative D-dimer. EXAM: CT ANGIOGRAPHY CHEST WITH CONTRAST TECHNIQUE: Multidetector CT imaging of the chest was performed using the standard protocol during bolus administration of intravenous contrast. Multiplanar CT image reconstructions and MIPs were obtained to evaluate the vascular anatomy. CONTRAST:  OMNIPAQUE IOHEXOL 350 MG/ML SOLN COMPARISON:  CT dated Mar 13, 2019 FINDINGS: Cardiovascular: Contrast injection is sufficient to demonstrate satisfactory opacification of the pulmonary arteries to the segmental level. There is no pulmonary embolus. The main pulmonary artery is within normal limits for size. There is no CT evidence of acute right heart strain. The visualized aorta is normal. Heart size is normal, without pericardial effusion. Mediastinum/Nodes: --No mediastinal or hilar lymphadenopathy. --No axillary lymphadenopathy. --No supraclavicular lymphadenopathy. --Normal thyroid gland. --The esophagus is unremarkable Lungs/Pleura: No pulmonary nodules or masses. No pleural effusion or pneumothorax. No focal airspace consolidation. No focal pleural abnormality. Upper Abdomen: No acute abnormality. Musculoskeletal: No chest wall abnormality. No acute or significant osseous findings. Review of the MIP images confirms the above findings. IMPRESSION: 1. No evidence of pulmonary embolism. 2. No acute findings in the chest. Electronically Signed   By: Katherine Mantle M.D.   On: 11/29/2019 21:57   DG Chest Portable 1 View  Result Date: 11/29/2019 CLINICAL DATA:  Chest pain and shortness of breath. EXAM: PORTABLE CHEST 1 VIEW COMPARISON:  February 12, 2019 FINDINGS: The heart size and mediastinal contours are within normal limits. Both lungs are clear. The visualized skeletal structures are unremarkable. IMPRESSION: No active disease. Electronically Signed    By: Aram Candela M.D.   On: 11/29/2019 20:46    Procedures Procedures (including critical care time)  Medications Ordered in ED Medications  albuterol (VENTOLIN HFA) 108 (90 Base) MCG/ACT inhaler 6 puff (6 puffs Inhalation Given 11/29/19 2008)  methylPREDNISolone sodium succinate (SOLU-MEDROL) 125 mg/2 mL injection 125 mg (125 mg Intravenous Given 11/29/19 2008)  iohexol (OMNIPAQUE) 350 MG/ML injection 100 mL (100 mLs Intravenous Contrast Given 11/29/19 2120)    ED Course  I have reviewed the triage vital signs and the nursing notes.  Pertinent labs & imaging results that were available during my care of the patient were reviewed by me and considered in my medical decision making (see chart for details).    MDM Rules/Calculators/A&P  ANALUISA TUDOR is a 49 year old female with history of asthma, Addison's disease who presents the ED with shortness of breath.  Has had symptoms for the last 2 weeks.  Finished a course of treatment for asthma exacerbation recently but starting to feel symptomatic again.  Has been using home nebulizer with some improvement.  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  Has had 2 negative coronavirus test over the last 2 weeks.  Denies any fever.  Has some slightly increased work of breathing, some scattered mild wheezing on exam.  EKG shows sinus rhythm but tachycardic.  D-dimer is negative but will get a CT scan of the chest to further evaluate for blood clot or other infectious finding.  Chest x-ray did not show any obvious pneumonia.  No pneumothorax.  Patient is not having chest pain.  Will test for coronavirus.  Will give breathing treatment, steroids and reevaluate.  CT scan of the chest showed no infectious process.  No PE.  Troponin normal.  EKG shows sinus rhythm.  No concern for cardiac process.  No significant anemia, electrolyte abnormality, kidney injury.  Covid test is negative.  Likely mild asthma exacerbation.  Patient feeling better after breathing  treatment and steroids.  Will do short steroid burst.  Given return precautions and discharged from the ED in good condition.  This chart was dictated using voice recognition software.  Despite best efforts to proofread,  errors can occur which can change the documentation meaning.  Caitlyn Hale was evaluated in Emergency Department on 11/29/2019 for the symptoms described in the history of present illness. She was evaluated in the context of the global COVID-19 pandemic, which necessitated consideration that the patient might be at risk for infection with the SARS-CoV-2 virus that causes COVID-19. Institutional protocols and algorithms that pertain to the evaluation of patients at risk for COVID-19 are in a state of rapid change based on information released by regulatory bodies including the CDC and federal and state organizations. These policies and algorithms were followed during the patient's care in the ED.   Final Clinical Impression(s) / ED Diagnoses Final diagnoses:  Mild intermittent asthma, unspecified whether complicated    Rx / DC Orders ED Discharge Orders    None       Virgina Norfolk, DO 11/29/19 2204

## 2019-11-29 NOTE — ED Notes (Addendum)
Patient transported to CT/XR ?

## 2020-12-15 ENCOUNTER — Other Ambulatory Visit: Payer: Self-pay | Admitting: Obstetrics and Gynecology

## 2020-12-15 DIAGNOSIS — Z0000 Encounter for general adult medical examination without abnormal findings: Secondary | ICD-10-CM

## 2021-02-03 ENCOUNTER — Ambulatory Visit: Payer: PRIVATE HEALTH INSURANCE

## 2021-03-22 ENCOUNTER — Other Ambulatory Visit: Payer: Self-pay

## 2021-03-22 ENCOUNTER — Ambulatory Visit
Admission: RE | Admit: 2021-03-22 | Discharge: 2021-03-22 | Disposition: A | Discharge status: Home / Self Care | Payer: PRIVATE HEALTH INSURANCE | Source: Ambulatory Visit | Attending: Obstetrics and Gynecology | Admitting: Obstetrics and Gynecology

## 2021-03-22 DIAGNOSIS — Z0000 Encounter for general adult medical examination without abnormal findings: Secondary | ICD-10-CM

## 2021-03-25 ENCOUNTER — Ambulatory Visit: Payer: PRIVATE HEALTH INSURANCE

## 2021-04-07 ENCOUNTER — Ambulatory Visit: Payer: PRIVATE HEALTH INSURANCE

## 2022-01-02 ENCOUNTER — Other Ambulatory Visit: Payer: Self-pay

## 2022-01-02 ENCOUNTER — Emergency Department (HOSPITAL_COMMUNITY)
Admission: EM | Admit: 2022-01-02 | Discharge: 2022-01-02 | Disposition: A | Discharge status: Home / Self Care | Payer: PRIVATE HEALTH INSURANCE | Attending: Emergency Medicine | Admitting: Emergency Medicine

## 2022-01-02 ENCOUNTER — Encounter (HOSPITAL_COMMUNITY): Payer: Self-pay

## 2022-01-02 ENCOUNTER — Emergency Department (HOSPITAL_COMMUNITY): Payer: PRIVATE HEALTH INSURANCE

## 2022-01-02 DIAGNOSIS — G44219 Episodic tension-type headache, not intractable: Secondary | ICD-10-CM | POA: Insufficient documentation

## 2022-01-02 DIAGNOSIS — Z79899 Other long term (current) drug therapy: Secondary | ICD-10-CM | POA: Diagnosis not present

## 2022-01-02 DIAGNOSIS — R519 Headache, unspecified: Secondary | ICD-10-CM

## 2022-01-02 DIAGNOSIS — Z91040 Latex allergy status: Secondary | ICD-10-CM | POA: Insufficient documentation

## 2022-01-02 DIAGNOSIS — R55 Syncope and collapse: Secondary | ICD-10-CM

## 2022-01-02 LAB — CBC WITH DIFFERENTIAL/PLATELET
Abs Immature Granulocytes: 0.03 10*3/uL (ref 0.00–0.07)
Basophils Absolute: 0 10*3/uL (ref 0.0–0.1)
Basophils Relative: 0 %
Eosinophils Absolute: 0.6 10*3/uL — ABNORMAL HIGH (ref 0.0–0.5)
Eosinophils Relative: 8 %
HCT: 34.8 % — ABNORMAL LOW (ref 36.0–46.0)
Hemoglobin: 11.2 g/dL — ABNORMAL LOW (ref 12.0–15.0)
Immature Granulocytes: 0 %
Lymphocytes Relative: 27 %
Lymphs Abs: 2 10*3/uL (ref 0.7–4.0)
MCH: 30.2 pg (ref 26.0–34.0)
MCHC: 32.2 g/dL (ref 30.0–36.0)
MCV: 93.8 fL (ref 80.0–100.0)
Monocytes Absolute: 0.5 10*3/uL (ref 0.1–1.0)
Monocytes Relative: 7 %
Neutro Abs: 4.3 10*3/uL (ref 1.7–7.7)
Neutrophils Relative %: 58 %
Platelets: 193 10*3/uL (ref 150–400)
RBC: 3.71 MIL/uL — ABNORMAL LOW (ref 3.87–5.11)
RDW: 12.2 % (ref 11.5–15.5)
WBC: 7.4 10*3/uL (ref 4.0–10.5)
nRBC: 0 % (ref 0.0–0.2)

## 2022-01-02 LAB — BASIC METABOLIC PANEL
Anion gap: 4 — ABNORMAL LOW (ref 5–15)
BUN: 17 mg/dL (ref 6–20)
CO2: 25 mmol/L (ref 22–32)
Calcium: 8.2 mg/dL — ABNORMAL LOW (ref 8.9–10.3)
Chloride: 109 mmol/L (ref 98–111)
Creatinine, Ser: 0.58 mg/dL (ref 0.44–1.00)
GFR, Estimated: >60 mL/min (ref >60)
Glucose, Bld: 97 mg/dL (ref 70–99)
Potassium: 3.5 mmol/L (ref 3.5–5.1)
Sodium: 138 mmol/L (ref 135–145)

## 2022-01-02 LAB — TROPONIN I (HIGH SENSITIVITY): Troponin I (High Sensitivity): <2 ng/L (ref <18)

## 2022-01-02 MED ORDER — PROCHLORPERAZINE EDISYLATE 10 MG/2ML IJ SOLN
10.0000 mg | Freq: Once | INTRAMUSCULAR | Status: AC
  Administered 2022-01-02: 10 mg via INTRAVENOUS
  Filled 2022-01-02: qty 2

## 2022-01-02 MED ORDER — DIPHENHYDRAMINE HCL 25 MG PO CAPS
25.0000 mg | ORAL_CAPSULE | Freq: Once | ORAL | Status: AC
  Administered 2022-01-02: 25 mg via ORAL
  Filled 2022-01-02: qty 1

## 2022-01-02 MED ORDER — SODIUM CHLORIDE 0.9 % IV BOLUS
500.0000 mL | Freq: Once | INTRAVENOUS | Status: AC
Start: 2022-01-02 — End: 2022-01-02
  Administered 2022-01-02: 500 mL via INTRAVENOUS

## 2022-01-02 MED ORDER — DEXAMETHASONE SODIUM PHOSPHATE 10 MG/ML IJ SOLN
10.0000 mg | Freq: Once | INTRAMUSCULAR | Status: AC
  Administered 2022-01-02: 10 mg via INTRAVENOUS
  Filled 2022-01-02: qty 1
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
## 2022-03-13 ENCOUNTER — Other Ambulatory Visit: Payer: Self-pay | Admitting: Obstetrics and Gynecology

## 2022-03-13 DIAGNOSIS — Z1231 Encounter for screening mammogram for malignant neoplasm of breast: Secondary | ICD-10-CM

## 2022-08-15 ENCOUNTER — Other Ambulatory Visit: Payer: Self-pay

## 2022-08-15 ENCOUNTER — Emergency Department (HOSPITAL_BASED_OUTPATIENT_CLINIC_OR_DEPARTMENT_OTHER): Payer: PRIVATE HEALTH INSURANCE

## 2022-08-15 ENCOUNTER — Emergency Department (HOSPITAL_BASED_OUTPATIENT_CLINIC_OR_DEPARTMENT_OTHER)
Admission: EM | Admit: 2022-08-15 | Discharge: 2022-08-15 | Disposition: A | Discharge status: Home / Self Care | Payer: PRIVATE HEALTH INSURANCE | Attending: Emergency Medicine | Admitting: Emergency Medicine

## 2022-08-15 ENCOUNTER — Encounter (HOSPITAL_BASED_OUTPATIENT_CLINIC_OR_DEPARTMENT_OTHER): Payer: Self-pay | Admitting: Emergency Medicine

## 2022-08-15 DIAGNOSIS — Z8673 Personal history of transient ischemic attack (TIA), and cerebral infarction without residual deficits: Secondary | ICD-10-CM | POA: Insufficient documentation

## 2022-08-15 DIAGNOSIS — S060X1A Concussion with loss of consciousness of 30 minutes or less, initial encounter: Secondary | ICD-10-CM | POA: Diagnosis not present

## 2022-08-15 DIAGNOSIS — S0990XA Unspecified injury of head, initial encounter: Secondary | ICD-10-CM

## 2022-08-15 DIAGNOSIS — Z1152 Encounter for screening for COVID-19: Secondary | ICD-10-CM | POA: Insufficient documentation

## 2022-08-15 DIAGNOSIS — J45909 Unspecified asthma, uncomplicated: Secondary | ICD-10-CM | POA: Insufficient documentation

## 2022-08-15 DIAGNOSIS — J0100 Acute maxillary sinusitis, unspecified: Secondary | ICD-10-CM

## 2022-08-15 DIAGNOSIS — W01198A Fall on same level from slipping, tripping and stumbling with subsequent striking against other object, initial encounter: Secondary | ICD-10-CM | POA: Insufficient documentation

## 2022-08-15 LAB — CBC
HCT: 34 % — ABNORMAL LOW (ref 36.0–46.0)
Hemoglobin: 11 g/dL — ABNORMAL LOW (ref 12.0–15.0)
MCH: 29.9 pg (ref 26.0–34.0)
MCHC: 32.4 g/dL (ref 30.0–36.0)
MCV: 92.4 fL (ref 80.0–100.0)
Platelets: 207 10*3/uL (ref 150–400)
RBC: 3.68 MIL/uL — ABNORMAL LOW (ref 3.87–5.11)
RDW: 13 % (ref 11.5–15.5)
WBC: 15 10*3/uL — ABNORMAL HIGH (ref 4.0–10.5)
nRBC: 0 % (ref 0.0–0.2)

## 2022-08-15 LAB — RESP PANEL BY RT-PCR (FLU A&B, COVID) ARPGX2
Influenza A by PCR: NEGATIVE
Influenza B by PCR: NEGATIVE
SARS Coronavirus 2 by RT PCR: NEGATIVE

## 2022-08-15 LAB — URINALYSIS, MICROSCOPIC (REFLEX): RBC / HPF: NONE SEEN RBC/hpf (ref 0–5)

## 2022-08-15 LAB — BASIC METABOLIC PANEL
Anion gap: 7 (ref 5–15)
BUN: 20 mg/dL (ref 6–20)
CO2: 27 mmol/L (ref 22–32)
Calcium: 9 mg/dL (ref 8.9–10.3)
Chloride: 104 mmol/L (ref 98–111)
Creatinine, Ser: 0.66 mg/dL (ref 0.44–1.00)
GFR, Estimated: >60 mL/min (ref >60)
Glucose, Bld: 104 mg/dL — ABNORMAL HIGH (ref 70–99)
Potassium: 3.9 mmol/L (ref 3.5–5.1)
Sodium: 138 mmol/L (ref 135–145)

## 2022-08-15 LAB — URINALYSIS, ROUTINE W REFLEX MICROSCOPIC
Bilirubin Urine: NEGATIVE
Glucose, UA: NEGATIVE mg/dL
Hgb urine dipstick: NEGATIVE
Ketones, ur: NEGATIVE mg/dL
Nitrite: NEGATIVE
Protein, ur: NEGATIVE mg/dL
Specific Gravity, Urine: 1.015 (ref 1.005–1.030)
pH: 6 (ref 5.0–8.0)

## 2022-08-15 MED ORDER — SODIUM CHLORIDE 0.9 % IV BOLUS
1000.0000 mL | Freq: Once | INTRAVENOUS | Status: AC
Start: 2022-08-15 — End: 2022-08-15
  Administered 2022-08-15: 1000 mL via INTRAVENOUS

## 2022-08-15 MED ORDER — PROCHLORPERAZINE EDISYLATE 10 MG/2ML IJ SOLN
10.0000 mg | Freq: Once | INTRAMUSCULAR | Status: AC
  Administered 2022-08-15: 10 mg via INTRAVENOUS
  Filled 2022-08-15: qty 2

## 2022-08-15 MED ORDER — AMOXICILLIN-POT CLAVULANATE 875-125 MG PO TABS
1.0000 | ORAL_TABLET | Freq: Once | ORAL | Status: AC
Start: 2022-08-15 — End: 2022-08-15
  Administered 2022-08-15: 1 via ORAL
  Filled 2022-08-15: qty 1

## 2022-08-15 MED ORDER — AMOXICILLIN-POT CLAVULANATE 875-125 MG PO TABS: 1.0000 | ORAL_TABLET | Freq: Two times a day (BID) | ORAL | 0 refills | Status: AC

## 2022-08-15 MED ORDER — ACETAMINOPHEN 325 MG PO TABS
650.0000 mg | ORAL_TABLET | Freq: Once | ORAL | Status: AC
  Administered 2022-08-15: 650 mg via ORAL
  Filled 2022-08-15: qty 2

## 2022-08-15 NOTE — ED Notes (Signed)
Patient transported to CT 

## 2022-08-15 NOTE — ED Notes (Signed)
Pt does wear glasses all the time. Visual acuity screening preformed with glasses.

## 2022-08-15 NOTE — Discharge Instructions (Addendum)
Please use Tylenol or ibuprofen for pain.  You may use 600 mg ibuprofen every 6 hours or 1000 mg of Tylenol every 6 hours.  You may choose to alternate between the 2.  This would be most effective.  Not to exceed 4 g of Tylenol within 24 hours.  Not to exceed 3200 mg ibuprofen 24 hours.  Please refer to the handout above regarding concussion precautions and expectations.  If you are still having significant symptoms after 1 to 2 weeks I recommend that you follow-up with the concussion clinic.  Please take the entire course of antibiotics that prescribed for sinus infection.  Please follow-up with your PCP to ensure resolution of symptoms.

## 2022-08-15 NOTE — ED Provider Notes (Signed)
[facility] EMERGENCY DEPARTMENT Provider Note   CSN: [id number] Arrival date & time: 08/15/22  2054     History  Chief Complaint  Patient presents with   Head Injury    Caitlyn Hale is a 51 y.o. female with past medical history significant for asthma, migraines who presents with concern for headache, intermittent right-sided blurry vision, nausea, body aches, cough, fever, concern for head injury with loss of consciousness.  Patient reports that she was hammering on her fence trying to take it down, fence post swung back and hit her in the forehead, reports that she lost consciousness, was out for less than a minute but greater than a few seconds.  Patient denies any chest pain.  She does not take any blood thinners.  No previous histories of head injury, loss of consciousness.  Patient reports that she has Imitrex and Zofran at home secondary to history of migraines, and took the above with no significant improvement of her symptoms.  Patient reports that she has not had the flu shot this year and had a TIA after her second COVID-vaccine so has not received any additional vaccines.   Head Injury      Home Medications Prior to Admission medications   Medication Sig Start Date End Date Taking? Authorizing Provider  amoxicillin-clavulanate (AUGMENTIN) 875-125 MG tablet Take 1 tablet by mouth every 12 (twelve) hours. 08/15/22  Yes Hera Celaya H, PA-C  albuterol (PROVENTIL HFA;VENTOLIN HFA) 108 (90 BASE) MCG/ACT inhaler Inhale 2 puffs into the lungs every 6 (six) hours as needed for wheezing or shortness of breath.  12/25/14   [provider]  busPIRone (BUSPAR) 7.5 MG tablet Take 7.5 mg by mouth at bedtime. 02/01/17   [provider]  FLUoxetine (PROZAC) 20 MG tablet Take 20 mg by mouth at bedtime. 01/03/17   [provider]  meclizine (ANTIVERT) 12.5 MG tablet Take 1 tablet (12.5 mg total) by mouth 3 (three) times daily as needed for dizziness.  07/23/17   Glyn Ade, PA-C  rOPINIRole (REQUIP) 0.5 MG tablet Take 1 mg by mouth at bedtime.    [provider]  SUMAtriptan (IMITREX) 25 MG tablet Take 1 tablet by mouth every 2 (two) hours as needed for migraine.  12/25/14   [provider]      Allergies    Latex, Alcohol-sulfur [elemental sulfur], Benadryl [diphenhydramine hcl], and Ibuprofen    Review of Systems   Review of Systems  All other systems reviewed and are negative.   Physical Exam Updated Vital Signs BP (!) 119/57   Pulse 89   Temp 98.7 F (37.1 C)   Resp 18   Ht 5\' 2"  (1.575 m)   Wt 76.2 kg   LMP 11/06/2016 Comment: Abnormal per patient 11/2016  SpO2 95%   BMI 30.73 kg/m  Physical Exam Vitals and nursing note reviewed.  Constitutional:      General: She is not in acute distress.    Appearance: Normal appearance.  HENT:     Head: Normocephalic and atraumatic.  Eyes:     General:        Right eye: No discharge.        Left eye: No discharge.  Cardiovascular:     Rate and Rhythm: Regular rhythm. Tachycardia present.     Heart sounds: No murmur heard.    No friction rub. No gallop.  Pulmonary:     Effort: Pulmonary effort is normal.     Breath sounds: Normal  breath sounds.     Comments: Intermittent wet sounding cough, no focal consolidation, wheezing, rhonchi, stridor on my exam Abdominal:     General: Bowel sounds are normal.     Palpations: Abdomen is soft.  Skin:    General: Skin is warm and dry.     Capillary Refill: Capillary refill takes less than 2 seconds.     Comments: Skin is very warm to the touch  Patient has a small contusion on the right frontal forehead  Neurological:     Mental Status: She is alert and oriented to person, place, and time.     Comments: Cranial nerves II through XII grossly intact.  Intact finger-nose, intact heel-to-shin.  Romberg negative, gait normal.  Alert and oriented x3.  Moves all 4 limbs spontaneously, normal coordination.  No  pronator drift.  Intact strength 5 out of 5 bilateral upper and lower extremities.    Psychiatric:        Mood and Affect: Mood normal.        Behavior: Behavior normal.     ED Results / Procedures / Treatments   Labs (all labs ordered are listed, but only abnormal results are displayed) Labs Reviewed  CBC - Abnormal; Notable for the following components:      Result Value   WBC 15.0 (*)    RBC 3.68 (*)    Hemoglobin 11.0 (*)    HCT 34.0 (*)    All other components within normal limits  BASIC METABOLIC PANEL - Abnormal; Notable for the following components:   Glucose, Bld 104 (*)    All other components within normal limits  URINALYSIS, ROUTINE W REFLEX MICROSCOPIC - Abnormal; Notable for the following components:   Leukocytes,Ua TRACE (*)    All other components within normal limits  URINALYSIS, MICROSCOPIC (REFLEX) - Abnormal; Notable for the following components:   Bacteria, UA RARE (*)    All other components within normal limits  RESP PANEL BY RT-PCR (FLU A&B, COVID) [id number]    EKG None  Radiology DG Chest 2 View  Result Date: 08/15/2022 CLINICAL DATA:  Dyspnea EXAM: CHEST - 2 VIEW COMPARISON:  None Available. FINDINGS: Lungs are well expanded, symmetric, and clear. No pneumothorax or pleural effusion. Cardiac size within normal limits. Pulmonary vascularity is normal. Osseous structures are age-appropriate. No acute bone abnormality. IMPRESSION: No active cardiopulmonary disease. Electronically Signed   By: Fidela Salisbury M.D.   On: 08/15/2022 22:05   CT Head Wo Contrast  Result Date: 08/15/2022 CLINICAL DATA:  Head trauma, moderate-severe. Blunt head injury, loss of consciousness EXAM: CT HEAD WITHOUT CONTRAST TECHNIQUE: Contiguous axial images were obtained from the base of the skull through the vertex without intravenous contrast. RADIATION DOSE REDUCTION: This exam was performed according to the departmental dose-optimization program which includes automated  exposure control, adjustment of the mA and/or kV according to patient size and/or use of iterative reconstruction technique. COMPARISON:  None Available. FINDINGS: Brain: Normal anatomic configuration. No abnormal intra or extra-axial mass lesion or fluid collection. No abnormal mass effect or midline shift. No evidence of acute intracranial hemorrhage or infarct. Ventricular size is normal. Cerebellum unremarkable. Vascular: Unremarkable Skull: Intact Sinuses/Orbits: Bilateral maxillary antrostomy, bilateral ethmoidectomy, and middle turbinectomy has been performed. There is extensive mucosal thickening within the maxillary sinuses. More moderate mucosal thickening within the residual ethmoid air cells. Air-fluid levels are noted within the maxillary sinuses and sphenoid sinuses bilaterally. The orbits are unremarkable. Other: Mastoid air cells and middle ear cavities are  clear. IMPRESSION: 1. No acute intracranial abnormality. No calvarial fracture. 2. Extensive paranasal sinus disease and postsurgical changes. Electronically Signed   By: Helyn Numbers M.D.   On: 08/15/2022 22:05    Procedures Procedures    Medications Ordered in ED Medications  acetaminophen (TYLENOL) tablet 650 mg (650 mg Oral Given 08/15/22 2153)  prochlorperazine (COMPAZINE) injection 10 mg (10 mg Intravenous Given 08/15/22 2157)  sodium chloride 0.9 % bolus 1,000 mL (0 mLs Intravenous Stopped 08/15/22 2331)  amoxicillin-clavulanate (AUGMENTIN) 875-125 MG per tablet 1 tablet (1 tablet Oral Given 08/15/22 2335)    ED Course/ Medical Decision Making/ A&P                           Medical Decision Making Amount and/or Complexity of Data Reviewed Labs: ordered. Radiology: ordered.  Risk OTC drugs. Prescription drug management.   This patient is a 51 y.o. female who presents to the ED for concern of headache, blurry vision, nausea, fever, chills, cough, this involves an extensive number of treatment options, and is a  complaint that carries with it a high risk of complications and morbidity. The emergent differential diagnosis prior to evaluation includes, but is not limited to, COVID, flu, other upper respiratory infection, acute bronchitis, asthma exacerbation, pneumonia, sinusitis, concussion, epidural or subdural hematoma, subarachnoid hemorrhage, other intracranial injury versus other.   This is not an exhaustive differential.   Past Medical History / Co-morbidities / Social History: Previous history of migraines, sinusotomy, tonsillectomy, asthma  Additional history: Chart reviewed. Pertinent results include: Reviewed outpatient pulmonology, urology, and family medicine visit as well as lab work, imaging from previous emergency department visits  Physical Exam: Physical exam performed. The pertinent findings include: On exam patient is neurologically intact with no focal deficits, she does have a fever on arrival, which is resolved, temperature of 98.7 after Tylenol, her mild tachycardia of 104 on arrival is also resolved on repeat evaluation she had mild systolic hypertension of 142 on arrival which is improved by time of discharge.  She is greater than 95% oxygen saturation on room air x2.  Otherwise she has an intermittent wet sounding cough but no focal lung sounds, wheezing, stridor, rhonchi.  She has a small contusion to the right frontal forehead.  Lab Tests: I ordered, and personally interpreted labs.  The pertinent results include: UA is unremarkable, with trace leukocytes and rare bacteria, however patient is not having any urinary symptoms, I do not think this is representative of a UTI.  RVP negative for COVID, flu.  CBC with leukocytosis, white blood cells 15 suggestive of possible bacterial infection, I suspect sinusitis.  Mild anemia, hemoglobin of 11.  BMP is unremarkable.   Imaging Studies: I ordered imaging studies including plain film chest x-ray, CT head without contrast. I independently  visualized and interpreted imaging which showed no acute intrathoracic abnormality, no intracranial bleed but severe sinus disease noted on CT of the head. I agree with the radiologist interpretation.   Medications: I ordered medication including Tylenol, Augmentin, fluid bolus, Compazine for headache, sinusitis, nausea. Reevaluation of the patient after these medicines showed that the patient improved. I have reviewed the patients home medicines and have made adjustments as needed.   Disposition: After consideration of the diagnostic results and the patients response to treatment, I feel that with history of head injury, loss of consciousness I am suspicious that patient has concussion/postconcussive syndrome, no evidence of more severe head injury on exam  and imaging.  Additionally patient with signs and symptoms of sinusitis, as patient has history of severe sinus disease, and fever in the emergency department as well as leukocytosis I think it is reasonable to treat her with antibiotics despite shorter than 10-day duration of symptoms.  Patient discharged with concussion precautions and encouraged to follow-up with concussion clinic, use ibuprofen, Tylenol, plenty of physical and brain rest.  Encouraged nasal saline, other over-the-counter medications to help with sinusitis in addition to the Augmentin.Marland Kitchen   emergency department workup does not suggest an emergent condition requiring admission or immediate intervention beyond what has been performed at this time. The plan is: as above. The patient is safe for discharge and has been instructed to return immediately for worsening symptoms, change in symptoms or any other concerns.  I discussed this case with my attending physician Dr. Philip Aspen who cosigned this note including patient's presenting symptoms, physical exam, and planned diagnostics and interventions. Attending physician stated agreement with plan or made changes to plan which were implemented.     Final Clinical Impression(s) / ED Diagnoses Final diagnoses:  Concussion with loss of consciousness of 30 minutes or less, initial encounter  Injury of head, initial encounter  Acute non-recurrent maxillary sinusitis    Rx / DC Orders ED Discharge Orders          Ordered    amoxicillin-clavulanate (AUGMENTIN) 875-125 MG tablet  Every 12 hours        08/15/22 2330              Anselmo Pickler, PA-C 08/15/22 2346    Fransico Meadow, MD 08/16/22 1328

## 2022-08-15 NOTE — ED Triage Notes (Signed)
Patient states she was taking down a fence and when she was hammering the fence post swung back and hit her in the forehead on Friday afternoon, states she had n/v and was knocked out for approx. 1 minute. States her headaches continue to increase and reports blurry vision in right eye.

## 2024-03-09 ENCOUNTER — Encounter (HOSPITAL_BASED_OUTPATIENT_CLINIC_OR_DEPARTMENT_OTHER): Payer: Self-pay | Admitting: Emergency Medicine

## 2024-03-09 ENCOUNTER — Emergency Department (HOSPITAL_BASED_OUTPATIENT_CLINIC_OR_DEPARTMENT_OTHER): Payer: PRIVATE HEALTH INSURANCE

## 2024-03-09 ENCOUNTER — Other Ambulatory Visit: Payer: Self-pay

## 2024-03-09 DIAGNOSIS — Z5321 Procedure and treatment not carried out due to patient leaving prior to being seen by health care provider: Secondary | ICD-10-CM | POA: Diagnosis not present

## 2024-03-09 DIAGNOSIS — G43909 Migraine, unspecified, not intractable, without status migrainosus: Secondary | ICD-10-CM | POA: Diagnosis not present

## 2024-03-09 DIAGNOSIS — R519 Headache, unspecified: Secondary | ICD-10-CM | POA: Diagnosis present

## 2024-03-09 NOTE — ED Triage Notes (Signed)
 Pt c/o migraine since Thurs; migraine medications not helping; had migraine cocktail at [facility] this morning- benadryl , toradol, solu-medrol , zofran , 500ml NS bolus; sts it helped reduce the intensity, but HA is still present

## 2024-03-10 ENCOUNTER — Emergency Department (HOSPITAL_BASED_OUTPATIENT_CLINIC_OR_DEPARTMENT_OTHER)
Admission: EM | Admit: 2024-03-10 | Discharge: 2024-03-10 | Discharge status: Against Medical Advice | Payer: PRIVATE HEALTH INSURANCE | Attending: Emergency Medicine | Admitting: Emergency Medicine

## 2024-03-10 NOTE — ED Notes (Signed)
 Called x 1, no answer. Pt not visualized in lobby.

## 2024-09-23 ENCOUNTER — Other Ambulatory Visit: Payer: Self-pay | Admitting: Obstetrics and Gynecology

## 2024-09-23 ENCOUNTER — Other Ambulatory Visit (HOSPITAL_COMMUNITY)
Admission: RE | Admit: 2024-09-23 | Discharge: 2024-09-23 | Disposition: A | Discharge status: Home / Self Care | Payer: PRIVATE HEALTH INSURANCE | Source: Ambulatory Visit | Attending: Obstetrics and Gynecology | Admitting: Obstetrics and Gynecology

## 2024-09-23 DIAGNOSIS — Z01419 Encounter for gynecological examination (general) (routine) without abnormal findings: Secondary | ICD-10-CM | POA: Insufficient documentation

## 2024-09-29 LAB — CYTOLOGY - PAP
Comment: NEGATIVE
Diagnosis: NEGATIVE
High risk HPV: NEGATIVE
